# Patient Record
Sex: Female | Born: 1955 | Hispanic: No | Smoking: Former smoker
Health system: Southern US, Community
[De-identification: ages and names within clinical notes are randomized; demographics above are authoritative.]

## PROBLEM LIST (undated history)

## (undated) DIAGNOSIS — R569 Unspecified convulsions: Secondary | ICD-10-CM

## (undated) DIAGNOSIS — I10 Essential (primary) hypertension: Secondary | ICD-10-CM

## (undated) DIAGNOSIS — G8929 Other chronic pain: Secondary | ICD-10-CM

## (undated) DIAGNOSIS — M549 Dorsalgia, unspecified: Secondary | ICD-10-CM

## (undated) DIAGNOSIS — E7800 Pure hypercholesterolemia, unspecified: Secondary | ICD-10-CM

## (undated) DIAGNOSIS — J449 Chronic obstructive pulmonary disease, unspecified: Secondary | ICD-10-CM

## (undated) HISTORY — PX: CHOLECYSTECTOMY: SHX55

## (undated) HISTORY — DX: Dorsalgia, unspecified: M54.9

## (undated) HISTORY — PX: ABDOMINAL HYSTERECTOMY: SHX81

---

## 2020-09-26 ENCOUNTER — Emergency Department (HOSPITAL_COMMUNITY)
Admission: EM | Admit: 2020-09-26 | Discharge: 2020-09-26 | Disposition: A | Discharge status: Home / Self Care | Payer: Medicare (Managed Care) | Attending: Emergency Medicine | Admitting: Emergency Medicine

## 2020-09-26 ENCOUNTER — Other Ambulatory Visit: Payer: Self-pay

## 2020-09-26 ENCOUNTER — Encounter (HOSPITAL_COMMUNITY): Payer: Self-pay

## 2020-09-26 DIAGNOSIS — Z87891 Personal history of nicotine dependence: Secondary | ICD-10-CM | POA: Insufficient documentation

## 2020-09-26 DIAGNOSIS — R059 Cough, unspecified: Secondary | ICD-10-CM

## 2020-09-26 DIAGNOSIS — G8929 Other chronic pain: Secondary | ICD-10-CM | POA: Diagnosis not present

## 2020-09-26 DIAGNOSIS — R202 Paresthesia of skin: Secondary | ICD-10-CM | POA: Insufficient documentation

## 2020-09-26 DIAGNOSIS — J449 Chronic obstructive pulmonary disease, unspecified: Secondary | ICD-10-CM | POA: Insufficient documentation

## 2020-09-26 DIAGNOSIS — M549 Dorsalgia, unspecified: Secondary | ICD-10-CM | POA: Diagnosis not present

## 2020-09-26 DIAGNOSIS — I10 Essential (primary) hypertension: Secondary | ICD-10-CM | POA: Insufficient documentation

## 2020-09-26 HISTORY — DX: Essential (primary) hypertension: I10

## 2020-09-26 HISTORY — DX: Unspecified convulsions: R56.9

## 2020-09-26 HISTORY — DX: Chronic obstructive pulmonary disease, unspecified: J44.9

## 2020-09-26 HISTORY — DX: Other chronic pain: G89.29

## 2020-09-26 HISTORY — DX: Pure hypercholesterolemia, unspecified: E78.00

## 2020-09-26 MED ORDER — ALBUTEROL SULFATE HFA 108 (90 BASE) MCG/ACT IN AERS
2.0000 | INHALATION_SPRAY | RESPIRATORY_TRACT | Status: DC | PRN
  Administered 2020-09-26: 2 via RESPIRATORY_TRACT
  Filled 2020-09-26: qty 6.7

## 2020-09-26 MED ORDER — PREDNISONE 20 MG PO TABS: ORAL_TABLET | ORAL | 0 refills | Status: DC

## 2020-09-26 MED ORDER — PREDNISONE 20 MG PO TABS
60.0000 mg | ORAL_TABLET | Freq: Once | ORAL | Status: AC
  Administered 2020-09-26: 60 mg via ORAL
  Filled 2020-09-26: qty 3

## 2020-09-26 NOTE — ED Provider Notes (Signed)
[name]-EMERGENCY DEPT Provider Note   CSN: [id number] Arrival date & time: 09/26/20  1328     History Chief Complaint  Patient presents with  . Foot Pain    Angela Giles is a 65 y.o. female.  Patient with history of COPD, chronic back pain with no previous surgery history, hypertension, high cholesterol, partial seizures --presents to the emergency department today for evaluation of paresthesias in her left lower extremity.  Patient states that the symptoms started yesterday.  She initially had a pins-and-needles, numb sensation in her foot and this has spread towards the ankle and up towards the knee.  She typically walks with a walker due to her chronic back pain for which she has taken Percocet.  Patient denies other signs of stroke including: facial droop, slurred speech, aphasia, imbalance/trouble walking.  No history of abnormal heartbeats or anticoagulation.  No fevers, upper extremity symptoms including weakness, numbness or tingling.         Past Medical History:  Diagnosis Date  . Chronic back pain   . COPD (chronic obstructive pulmonary disease) (HCC)   . High cholesterol   . Hypertension   . Seizures (HCC)     There are no problems to display for this patient.   Past Surgical History:  Procedure Laterality Date  . ABDOMINAL HYSTERECTOMY    . CHOLECYSTECTOMY       OB History   No obstetric history on file.     Family History  Problem Relation Age of Onset  . Dementia Mother     Social History   Tobacco Use  . Smoking status: Former Games developer  . Smokeless tobacco: Never Used  Vaping Use  . Vaping Use: Never used  Substance Use Topics  . Alcohol use: Never  . Drug use: Never    Home Medications Prior to Admission medications   Not on File    Allergies    Beef-derived products, Fish allergy, and Milk-related compounds  Review of Systems   Review of Systems  Constitutional: Negative for fever and unexpected  weight change.  Eyes: Negative for visual disturbance.  Respiratory: Positive for cough.   Gastrointestinal: Negative for constipation.       Negative for fecal incontinence.   Genitourinary: Negative for dysuria, flank pain, hematuria and pelvic pain.       Negative for urinary incontinence or retention.  Musculoskeletal: Positive for back pain (chronic). Negative for gait problem.  Neurological: Positive for numbness (paresthesia). Negative for dizziness, seizures, facial asymmetry, speech difficulty, weakness, light-headedness and headaches.       Denies saddle paresthesias.    Physical Exam Updated Vital Signs BP (!) 193/83   Pulse 90   Temp 98.4 F (36.9 C) (Oral)   Resp 18   Ht 5' (1.524 m)   Wt 73.5 kg   SpO2 99%   BMI 31.64 kg/m   Physical Exam Vitals and nursing note reviewed.  Constitutional:      General: She is not in acute distress.    Appearance: She is well-developed.  HENT:     Head: Normocephalic and atraumatic.     Right Ear: Tympanic membrane, ear canal and external ear normal.     Left Ear: Tympanic membrane, ear canal and external ear normal.     Nose: Nose normal.     Mouth/Throat:     Pharynx: Uvula midline.  Eyes:     General: Lids are normal.     Extraocular Movements:  Right eye: No nystagmus.     Left eye: No nystagmus.     Conjunctiva/sclera: Conjunctivae normal.     Pupils: Pupils are equal, round, and reactive to light.  Neck:     Vascular: No carotid bruit.     Comments: No carotid bruit heard.  Cardiovascular:     Rate and Rhythm: Normal rate and regular rhythm.     Heart sounds: No murmur heard.     Comments: Rhythm is regular.  Pulmonary:     Effort: Pulmonary effort is normal. No respiratory distress.     Breath sounds: Normal breath sounds. No wheezing, rhonchi or rales.     Comments: Pt coughs after I have her hold her breath Abdominal:     Palpations: Abdomen is soft.     Tenderness: There is no abdominal tenderness.  There is no guarding or rebound.  Musculoskeletal:     Cervical back: Normal range of motion and neck supple. No tenderness or bony tenderness.     Thoracic back: No tenderness.     Lumbar back: Tenderness (bilateral ) present. No bony tenderness.     Right lower leg: No edema.     Left lower leg: No edema.     Comments: I do not discern any edema in the bilateral lower extremities and they appear symmetric.   Skin:    General: Skin is warm and dry.     Findings: No rash.  Neurological:     General: No focal deficit present.     Mental Status: She is alert and oriented to person, place, and time. Mental status is at baseline.     GCS: GCS eye subscore is 4. GCS verbal subscore is 5. GCS motor subscore is 6.     Cranial Nerves: No cranial nerve deficit.     Sensory: Sensory deficit present.     Motor: No weakness.     Coordination: Coordination normal.     Gait: Gait normal.     Comments: Sensation is present in L lower extremity -- however pt reports decreased sharp sensation in L foot (entire), L ankle, L lower leg.   Psychiatric:        Mood and Affect: Mood normal.     ED Results / Procedures / Treatments   Labs (all labs ordered are listed, but only abnormal results are displayed) Labs Reviewed - No data to display  EKG None  Radiology No results found.  Procedures Procedures   Medications Ordered in ED Medications  albuterol (VENTOLIN HFA) 108 (90 Base) MCG/ACT inhaler 2 puff (has no administration in time range)  predniSONE (DELTASONE) tablet 60 mg (has no administration in time range)    ED Course  I have reviewed the triage vital signs and the nursing notes.  Pertinent labs & imaging results that were available during my care of the patient were reviewed by me and considered in my medical decision making (see chart for details).  Patient seen and examined. Pt discussed with Dr. Lynelle Doctor. No compelling indication for acute stroke today however wanted another  opinion. Dr. Lynelle Doctor agrees this is most likely peripheral in nature, possibly related to chronic back problems.   Vital signs reviewed and are as follows: BP (!) 193/83   Pulse 90   Temp 98.4 F (36.9 C) (Oral)   Resp 18   Ht 5' (1.524 m)   Wt 73.5 kg   SpO2 99%   BMI 31.64 kg/m   Plan: tapered course steroids, she  is also requesting albuterol inhaler for cough.   Patient counseled to return if they have weakness in their arms or legs, slurred speech, trouble walking or talking, confusion, trouble with their balance, or if they have any other concerns. Patient verbalizes understanding and agrees with plan.      MDM Rules/Calculators/A&P                          Pt with left lower extremity paresthesias without full numbness.  No compelling symptoms for acute stroke, but does have risk factors.  Patient has chronic back problems and symptoms are likely radicular in nature.  Patient also with cough and history of COPD.  Treatment as above.  Patient discussed with and seen by attending physician.   Final Clinical Impression(s) / ED Diagnoses Final diagnoses:  Paresthesia of left lower extremity  Cough    Rx / DC Orders ED Discharge Orders         Ordered    predniSONE (DELTASONE) 20 MG tablet        09/26/20 1459           Renne Crigler, PA-C 09/26/20 1511    Linwood Dibbles, MD 09/27/20 [phone]

## 2020-09-26 NOTE — ED Triage Notes (Signed)
Patient c/o left foot tingling and numbness to the left foot. Patient states that it started yesterday.

## 2020-09-26 NOTE — Discharge Instructions (Signed)
Please read and follow all provided instructions.  Your diagnoses today include:  1. Paresthesia of left lower extremity   2. Cough     Tests performed today include:  Vital signs. See below for your results today.   Medications prescribed:   Prednisone - steroid medicine   It is best to take this medication in the morning to prevent sleeping problems. If you are diabetic, monitor your blood sugar closely and stop taking Prednisone if blood sugar is over 300. Take with food to prevent stomach upset.    Albuterol inhaler - medication that opens up your airway  Use inhaler as follows: 1-2 puffs with spacer every 4 hours as needed for wheezing, cough, or shortness of breath.   Take any prescribed medications only as directed.  Home care instructions:  Follow any educational materials contained in this packet.  BE VERY CAREFUL not to take multiple medicines containing Tylenol (also called acetaminophen). Doing so can lead to an overdose which can damage your liver and cause liver failure and possibly death.   Follow-up instructions: Please follow-up with your primary care provider in the next 7 days for further evaluation of your symptoms.   Return instructions:   Please return to the Emergency Department if you experience worsening symptoms.   Please return if you have any other emergent concerns.  Additional Information:  Your vital signs today were: BP (!) 193/83   Pulse 90   Temp 98.4 F (36.9 C) (Oral)   Resp 18   Ht 5' (1.524 m)   Wt 73.5 kg   SpO2 99%   BMI 31.64 kg/m  If your blood pressure (BP) was elevated above 135/85 this visit, please have this repeated by your doctor within one month. --------------

## 2021-03-14 ENCOUNTER — Other Ambulatory Visit: Payer: Self-pay

## 2021-03-14 ENCOUNTER — Emergency Department (HOSPITAL_COMMUNITY)
Admission: EM | Admit: 2021-03-14 | Discharge: 2021-03-14 | Disposition: A | Discharge status: Home / Self Care | Payer: Medicare (Managed Care) | Attending: Emergency Medicine | Admitting: Emergency Medicine

## 2021-03-14 ENCOUNTER — Encounter (HOSPITAL_COMMUNITY): Payer: Self-pay

## 2021-03-14 ENCOUNTER — Emergency Department (HOSPITAL_COMMUNITY): Payer: Medicare (Managed Care)

## 2021-03-14 DIAGNOSIS — G589 Mononeuropathy, unspecified: Secondary | ICD-10-CM | POA: Diagnosis not present

## 2021-03-14 DIAGNOSIS — M25572 Pain in left ankle and joints of left foot: Secondary | ICD-10-CM | POA: Insufficient documentation

## 2021-03-14 DIAGNOSIS — Z79899 Other long term (current) drug therapy: Secondary | ICD-10-CM | POA: Insufficient documentation

## 2021-03-14 DIAGNOSIS — Z87891 Personal history of nicotine dependence: Secondary | ICD-10-CM | POA: Diagnosis not present

## 2021-03-14 DIAGNOSIS — J449 Chronic obstructive pulmonary disease, unspecified: Secondary | ICD-10-CM | POA: Diagnosis not present

## 2021-03-14 DIAGNOSIS — G8929 Other chronic pain: Secondary | ICD-10-CM | POA: Diagnosis not present

## 2021-03-14 DIAGNOSIS — I10 Essential (primary) hypertension: Secondary | ICD-10-CM | POA: Diagnosis not present

## 2021-03-14 DIAGNOSIS — G588 Other specified mononeuropathies: Secondary | ICD-10-CM

## 2021-03-14 LAB — CBC
HCT: 37.4 % (ref 36.0–46.0)
Hemoglobin: 12.9 g/dL (ref 12.0–15.0)
MCH: 28.8 pg (ref 26.0–34.0)
MCHC: 34.5 g/dL (ref 30.0–36.0)
MCV: 83.5 fL (ref 80.0–100.0)
Platelets: 334 10*3/uL (ref 150–400)
RBC: 4.48 MIL/uL (ref 3.87–5.11)
RDW: 12.8 % (ref 11.5–15.5)
WBC: 8.3 10*3/uL (ref 4.0–10.5)
nRBC: 0 % (ref 0.0–0.2)

## 2021-03-14 LAB — BASIC METABOLIC PANEL
Anion gap: 12 (ref 5–15)
BUN: 15 mg/dL (ref 8–23)
CO2: 26 mmol/L (ref 22–32)
Calcium: 9.2 mg/dL (ref 8.9–10.3)
Chloride: 92 mmol/L — ABNORMAL LOW (ref 98–111)
Creatinine, Ser: 0.87 mg/dL (ref 0.44–1.00)
GFR, Estimated: >60 mL/min (ref >60)
Glucose, Bld: 111 mg/dL — ABNORMAL HIGH (ref 70–99)
Potassium: 4 mmol/L (ref 3.5–5.1)
Sodium: 130 mmol/L — ABNORMAL LOW (ref 135–145)

## 2021-03-14 LAB — MAGNESIUM: Magnesium: 2 mg/dL (ref 1.7–2.4)

## 2021-03-14 MED ORDER — ATROVENT HFA 17 MCG/ACT IN AERS: 2.0000 | INHALATION_SPRAY | Freq: Four times a day (QID) | RESPIRATORY_TRACT | 0 refills | Status: DC | PRN

## 2021-03-14 MED ORDER — HYDROCHLOROTHIAZIDE 25 MG PO TABS: 25.0000 mg | ORAL_TABLET | Freq: Every day | ORAL | 0 refills | Status: DC

## 2021-03-14 MED ORDER — IBUPROFEN 600 MG PO TABS: 600.0000 mg | ORAL_TABLET | Freq: Four times a day (QID) | ORAL | 0 refills | Status: DC | PRN

## 2021-03-14 MED ORDER — SIMVASTATIN 20 MG PO TABS: 20.0000 mg | ORAL_TABLET | Freq: Every day | ORAL | 0 refills | Status: DC

## 2021-03-14 MED ORDER — HYDROCODONE-ACETAMINOPHEN 5-325 MG PO TABS
1.0000 | ORAL_TABLET | Freq: Once | ORAL | Status: AC
Start: 2021-03-14 — End: 2021-03-14
  Administered 2021-03-14: 1 via ORAL
  Filled 2021-03-14: qty 1

## 2021-03-14 MED ORDER — METHOCARBAMOL 500 MG PO TABS: 500.0000 mg | ORAL_TABLET | Freq: Two times a day (BID) | ORAL | 0 refills | Status: DC | PRN

## 2021-03-14 MED ORDER — KETOROLAC TROMETHAMINE 15 MG/ML IJ SOLN
15.0000 mg | Freq: Once | INTRAMUSCULAR | Status: AC
  Administered 2021-03-14: 15 mg via INTRAVENOUS
  Filled 2021-03-14: qty 1

## 2021-03-14 NOTE — Care Management (Signed)
Unable to access match system at this time,

## 2021-03-14 NOTE — ED Triage Notes (Signed)
Pt reports bilateral leg numbness for a few days. Pt reports numbness and tingling is worse in the left leg. Pt denies any other numbness and weakness.

## 2021-03-14 NOTE — ED Provider Notes (Signed)
[facility]-EMERGENCY DEPT Provider Note   CSN: [id number] Arrival date & time: 03/14/21  1409     History Chief Complaint  Patient presents with   Numbness    Angela Giles is a 65 y.o. female.  65 year old female with history as below presents ER secondary to left ankle pain.  Initial onset approximately a year ago.  She was evaluated by provider approximately 5 months ago with similar complaint.  Patient reports discomfort localized to her left ankle and intermittently she appreciated to her anterior lower extremity.  Described as a "pins-and-needles" sensation.  She does typically ambulate with a walker secondary to chronic back pain.  No recent falls or new trauma.  Back pain described as her baseline.  No urinary overflow or bowel incontinence.  No IV drug use, no spinal injection history in the last 6 months.  No recent falls.  No headaches, no numbness, no difficulty speaking or swallowing.  No chest pain or dyspnea.  No abdominal pain.  Patient reports that since she moved from [place] able to obtain her home medications, including her Percocet which she typically would take for her back pain  The history is provided by the patient. No language interpreter was used.      Past Medical History:  Diagnosis Date   Chronic back pain    COPD (chronic obstructive pulmonary disease) (HCC)    High cholesterol    Hypertension    Seizures (HCC)     There are no problems to display for this patient.   Past Surgical History:  Procedure Laterality Date   ABDOMINAL HYSTERECTOMY     CHOLECYSTECTOMY       OB History   No obstetric history on file.     Family History  Problem Relation Age of Onset   Dementia Mother     Social History   Tobacco Use   Smoking status: Former   Smokeless tobacco: Never  Building services engineer Use: Never used  Substance Use Topics   Alcohol use: Never   Drug use: Never    Home Medications Prior to Admission  medications   Medication Sig Start Date End Date Taking? Authorizing Provider  methocarbamol (ROBAXIN) 500 MG tablet Take 1 tablet (500 mg total) by mouth 2 (two) times daily as needed for muscle spasms. 03/14/21  Yes Tanda Rockers A, DO  hydrochlorothiazide (HYDRODIURIL) 25 MG tablet Take 1 tablet (25 mg total) by mouth daily. 03/14/21 04/13/21 Yes Tanda Rockers A, DO  ibuprofen (ADVIL) 600 MG tablet Take 1 tablet (600 mg total) by mouth every 6 (six) hours as needed. 03/14/21  Yes Tanda Rockers A, DO  ipratropium (ATROVENT HFA) 17 MCG/ACT inhaler Inhale 2 puffs into the lungs every 6 (six) hours as needed for wheezing. 03/14/21  Yes Tanda Rockers A, DO  predniSONE (DELTASONE) 20 MG tablet 3 Tabs PO Days 1-3, then 2 tabs PO Days 4-6, then 1 tab PO Day 7-9, then Half Tab PO Day 10-12 09/26/20   Renne Crigler, PA-C  simvastatin (ZOCOR) 20 MG tablet Take 1 tablet (20 mg total) by mouth daily. 03/14/21  Yes Tanda Rockers A, DO    Allergies    Beef-derived products, Fish allergy, and Milk-related compounds  Review of Systems   Review of Systems  Constitutional:  Negative for chills and fever.  HENT:  Negative for facial swelling and trouble swallowing.   Eyes:  Negative for photophobia and visual disturbance.  Respiratory:  Negative for cough  and shortness of breath.   Cardiovascular:  Negative for chest pain and palpitations.  Gastrointestinal:  Negative for abdominal pain, nausea and vomiting.  Endocrine: Negative for polydipsia and polyuria.  Genitourinary:  Negative for difficulty urinating and hematuria.  Musculoskeletal:  Positive for arthralgias. Negative for gait problem and joint swelling.  Skin:  Negative for pallor and rash.  Neurological:  Negative for syncope and headaches.  Psychiatric/Behavioral:  Negative for agitation and confusion.    Physical Exam Updated Vital Signs BP 140/67   Pulse 60   Temp 99 F (37.2 C) (Oral)   Resp 17   SpO2 100%   Physical Exam Vitals and nursing  note reviewed.  Constitutional:      General: She is not in acute distress.    Appearance: Normal appearance.  HENT:     Head: Normocephalic and atraumatic.     Right Ear: External ear normal.     Left Ear: External ear normal.     Nose: Nose normal.     Mouth/Throat:     Mouth: Mucous membranes are moist.  Eyes:     General: No scleral icterus.       Right eye: No discharge.        Left eye: No discharge.     Extraocular Movements: Extraocular movements intact.     Pupils: Pupils are equal, round, and reactive to light.  Cardiovascular:     Rate and Rhythm: Normal rate and regular rhythm.     Pulses: Normal pulses.     Heart sounds: Normal heart sounds.  Pulmonary:     Effort: Pulmonary effort is normal. No respiratory distress.     Breath sounds: Normal breath sounds.  Abdominal:     General: Abdomen is flat.     Tenderness: There is no abdominal tenderness.  Musculoskeletal:        General: Normal range of motion.     Cervical back: Normal range of motion.     Right lower leg: No deformity. No edema.     Left lower leg: No deformity. No edema.     Comments: No lower extremity swelling.  Extremities neurovascularly intact equal bilaterally, 2+ DP and PT pulses symmetric and equal b/l.  Tenderness palpation to left ankle anteriorly. No knee abnormality to either knee. No pain with provocative testing of knee.   No midline spinous process tenderness to palpation or percussion, no crepitus or stepoff.   Skin:    General: Skin is warm and dry.     Capillary Refill: Capillary refill takes less than 2 seconds.  Neurological:     Mental Status: She is alert and oriented to person, place, and time.     GCS: GCS eye subscore is 4. GCS verbal subscore is 5. GCS motor subscore is 6.     Cranial Nerves: Cranial nerves are intact.     Sensory: Sensation is intact.     Motor: Motor function is intact.     Coordination: Coordination is intact.  Psychiatric:        Mood and Affect:  Mood normal.        Behavior: Behavior normal.    ED Results / Procedures / Treatments   Labs (all labs ordered are listed, but only abnormal results are displayed) Labs Reviewed  BASIC METABOLIC PANEL - Abnormal; Notable for the following components:      Result Value   Sodium 130 (*)    Chloride 92 (*)    Glucose, Bld 111 (*)  All other components within normal limits  CBC  MAGNESIUM    EKG None  Radiology DG Knee 2 Views Left  Result Date: 03/14/2021 CLINICAL DATA:  Acute left knee pain without known injury. EXAM: LEFT KNEE - 1-2 VIEW COMPARISON:  None. FINDINGS: No evidence of fracture, dislocation, or joint effusion. No evidence of arthropathy or other focal bone abnormality. Soft tissues are unremarkable. IMPRESSION: Negative. Electronically Signed   By: Lupita Raider M.D.   On: 03/14/2021 15:42   DG Ankle Complete Left  Result Date: 03/14/2021 CLINICAL DATA:  Acute left ankle pain without known injury. EXAM: LEFT ANKLE COMPLETE - 3+ VIEW COMPARISON:  None. FINDINGS: There is no evidence of fracture, dislocation, or joint effusion. There is no evidence of arthropathy or other focal bone abnormality. Soft tissues are unremarkable. IMPRESSION: Negative. Electronically Signed   By: Lupita Raider M.D.   On: 03/14/2021 15:42    Procedures Procedures   Medications Ordered in ED Medications  HYDROcodone-acetaminophen (NORCO/VICODIN) 5-325 MG per tablet 1 tablet (1 tablet Oral Given 03/14/21 1552)  ketorolac (TORADOL) 15 MG/ML injection 15 mg (15 mg Intravenous Given 03/14/21 1611)    ED Course  I have reviewed the triage vital signs and the nursing notes.  Pertinent labs & imaging results that were available during my care of the patient were reviewed by me and considered in my medical decision making (see chart for details).    MDM Rules/Calculators/A&P                           This patient complains of ankle pain; this involves an extensive number of  treatment Options and is a complaint that carries with it a high risk of complications and Morbidity.  Neurologic exam is nonfocal.  Nontoxic appearing female, vital signs reviewed and are stable. Serious etiologies considered.    No saddle paresthesias.  No acute low back pain. Low suspicion for spinal cord impingement, acute demyelinating syndrome, cauda equina, infection or other serious etiology. Pain is paraspinal, it is her typical chronic pain. No cellulitis.    Pt is ambulatory at her typical level. Labs reviewed and are stable. Patient reports her pain has improved following intervention. Neurological exam is non-focal.   No evidence of joint effusion or septic joint. Chronic pain to left ankle.   XR reviewed and is non-acute  Patient is from out of state and has not established with PCP in the area since move. Given refills on her Zocor, HCTZ, and atrovent after revieweing last PCP note from [facility]. Also give robaxin/motrin for complaints today.    Advised her to f/u with neurology and new PCP (given contact info regarding this)  Favor peripheral neuropathy as etiology of presentation today. Would likely benefit from further neurological eval, possible EMG.  The patient improved significantly and was discharged in stable condition. Detailed discussions were had with the patient regarding current findings, and need for close f/u with PCP or on call doctor. The patient has been instructed to return immediately if the symptoms worsen in any way for re-evaluation. Patient verbalized understanding and is in agreement with current care plan. All questions answered prior to discharge.   Final Clinical Impression(s) / ED Diagnoses Final diagnoses:  Other mononeuropathy  Chronic pain of left ankle    Rx / DC Orders ED Discharge Orders          Ordered    simvastatin (ZOCOR) 20 MG tablet  Daily        03/14/21 1806    hydrochlorothiazide (HYDRODIURIL) 25 MG tablet  Daily         03/14/21 1806    ipratropium (ATROVENT HFA) 17 MCG/ACT inhaler  Every 6 hours PRN        03/14/21 1806    ibuprofen (ADVIL) 600 MG tablet  Every 6 hours PRN        03/14/21 1806    methocarbamol (ROBAXIN) 500 MG tablet  2 times daily PRN        03/14/21 1807             Sloan Leiter, DO 03/14/21 1814

## 2021-03-14 NOTE — Progress Notes (Signed)
CSW notified case management of medication needs.

## 2021-03-15 ENCOUNTER — Telehealth: Payer: Self-pay

## 2021-03-15 NOTE — Telephone Encounter (Signed)
Spoke with patient, cannot do MATCH program as she has medicaid elsewhere. Will consult to Finacial councelling to call patient to assist in transferring medicaid over. To [place]. Discussed using good rx card. Patient states [facility] [address], as she cannot get them at [facility], she does not drive and cannot work. Reiterated to call on Monday to [facility] to get an appointment and speka to [facility] there as well.

## 2021-03-19 ENCOUNTER — Encounter: Payer: Self-pay | Admitting: Neurology

## 2021-05-25 ENCOUNTER — Ambulatory Visit (INDEPENDENT_AMBULATORY_CARE_PROVIDER_SITE_OTHER): Payer: Self-pay | Admitting: Neurology

## 2021-05-25 ENCOUNTER — Encounter: Payer: Self-pay | Admitting: Neurology

## 2021-05-25 ENCOUNTER — Other Ambulatory Visit: Payer: Self-pay

## 2021-05-25 VITALS — BP 144/77 | HR 79 | Ht 60.0 in | Wt 147.0 lb

## 2021-05-25 DIAGNOSIS — R202 Paresthesia of skin: Secondary | ICD-10-CM

## 2021-05-25 DIAGNOSIS — G40109 Localization-related (focal) (partial) symptomatic epilepsy and epileptic syndromes with simple partial seizures, not intractable, without status epilepticus: Secondary | ICD-10-CM | POA: Diagnosis not present

## 2021-05-25 MED ORDER — LACOSAMIDE 200 MG PO TABS: 200.0000 mg | ORAL_TABLET | Freq: Two times a day (BID) | ORAL | 2 refills | Status: DC

## 2021-05-25 MED ORDER — APTIOM 400 MG PO TABS: 1.0000 | ORAL_TABLET | Freq: Every day | ORAL | 2 refills | Status: DC

## 2021-05-25 NOTE — Progress Notes (Signed)
[facility] Clinic Note - Initial Visit   Date: 05/25/21  Angela Giles MRN: [id number] DOB: 1955/12/05   Dear Dr.Gray:  Thank you for your kind referral of Angela Giles for consultation of left foot numbness. Although her history is well known to you, please allow [place] to reiterate it for the purpose of our medical record. The patient was accompanied to the clinic by self.  History of Present Illness: Angela Giles is a 65 y.o. female with hypertension, hyperlipidemia, COPD, epilepsy, and chronic back pain presenting for evaluation of left foot numbness.  She moved from [place] in December and lives with her niece. She was seen in the ER on 03/14/2021 for left ankle pain and numbness. For the past few months, she has numbness over the medial foot. Symptoms are intermittent throughout the week, about 2-3 times.  No specific triggers, it can occur at rest or with activity. She had chronic low back pain and uses a walker.   She also has history of temporal lobe epilepsy, previously followed by Roselind Rily, MD at [facility]. Last office note from 12/11/2019 was reviewed and indicates that seizures are well-controlled on Vimpat 200mg  BID and Aptiom 400mg  qhs. She is requesting refills. As per their last office note: The patient has a history of multiple suspicious spells, the first of which occurred in 1977. As per the patient, she was walking in [place] at the time when she suddenly felt very hot, became very pale, her ears began to ring, her vision became blurred, and she "passed out" for several minutes. She was taken by her mother to the doctor and was told that she had photosensitivity and no further testing was done. There was no tongue biting, urinary/bowel incontinence, "shaking", or eye rolling. Since that time she has had additional episodes, all associated with "very hot feelings" which could be aborted by splashing cold water on her body. One such  event occurred when she was lying in bed at approximately 2 AM she got up to vomit and use the bathroom, she again felt hot, her ears began to ring, and she woke up on the floor. She was told by her boyfriend that she was altered for "a couple of minutes", but she was able to speak to him coherently, knew her name, and knew where she was. She did, however, feel that her head was "heavy" and "not right" for several days afterwards. She was sent to see a neurologist (Dr. ) who felt that she may have seizures and referred her to [facility]. She was also sent to a cardiologist and had a normal EKG and carotid ultrasound, reportedly. She has no history of perinatal injury, febrile seizure, significant traumatic brain injury, stroke, brain hemorrhages, brain tumors, meningitis, encephalitis, alcohol/ drug abuse, or other epileptic predispositions.  She has not establsihed care with primary care provider  She moved from [place] in December and she lives with her niece.  She does not work.   Out-side paper records, electronic medical record, and images have been reviewed where available and summarized as:  07/2012 EEG - Unremarkable 08/2012 vEEG (5 days) - Frequent rhythmic delta activity and sharp waves in the right frontal temporal area with spread to left frontal area in sleep. Rare left temporal sharp waves 08/2013 AEEG - Unremarkable 07/2014 vEEG (5 days) - Unremarkable 10/07/2015 EEG - Epileptiform activity in the bitemporal region compatible with possible seizure disorder. Intermittent bilateral frontotemporal slowing. 01/2016 AEEG (3  days) - left temporal sharp waves 08/2012 MRI brain, 3 Tesla, with and without contrast, with seizure protocol - Unremarkable  Past Medical History:  Diagnosis Date   Chronic back pain    COPD (chronic obstructive pulmonary disease) (HCC)    Dorsalgia    High cholesterol    Hypertension    Seizures (HCC)     Past Surgical History:  Procedure Laterality Date    ABDOMINAL HYSTERECTOMY     CHOLECYSTECTOMY       Medications:  Outpatient Encounter Medications as of 05/25/2021  Medication Sig   albuterol (VENTOLIN HFA) 108 (90 Base) MCG/ACT inhaler SMARTSIG:1-2 Puff(s) By Mouth Every 4 Hours PRN   amLODipine (NORVASC) 10 MG tablet Take 10 mg by mouth daily.   BAYER ASPIRIN PO Take by mouth.   cyclobenzaprine (FLEXERIL) 10 MG tablet Take by mouth.   diphenhydrAMINE (BENADRYL) 25 MG tablet Take 25 mg by mouth every 6 (six) hours as needed.   hydrochlorothiazide (HYDRODIURIL) 25 MG tablet Take 1 tablet (25 mg total) by mouth daily.   ibuprofen (ADVIL) 600 MG tablet Take 1 tablet (600 mg total) by mouth every 6 (six) hours as needed.   ipratropium (ATROVENT HFA) 17 MCG/ACT inhaler Inhale 2 puffs into the lungs every 6 (six) hours as needed for wheezing.   ipratropium (ATROVENT) 0.06 % nasal spray Place 1 spray into both nostrils daily.   loratadine (CLARITIN) 10 MG tablet Take 10 mg by mouth daily.   Melatonin 5 MG CAPS Take by mouth.   methocarbamol (ROBAXIN) 500 MG tablet Take 1 tablet (500 mg total) by mouth 2 (two) times daily as needed for muscle spasms.   montelukast (SINGULAIR) 5 MG chewable tablet Chew 10 mg by mouth at bedtime.   naproxen (NAPROSYN) 500 MG tablet Take 500 mg by mouth 2 (two) times daily.   omeprazole (PRILOSEC) 20 MG capsule Take 20 mg by mouth daily.   simvastatin (ZOCOR) 20 MG tablet Take 1 tablet (20 mg total) by mouth daily.   SYMBICORT 160-4.5 MCG/ACT inhaler SMARTSIG:2 Puff(s) By Mouth Twice Daily   [DISCONTINUED] APTIOM 400 MG TABS Take 1 tablet by mouth daily.   [DISCONTINUED] lacosamide (VIMPAT) 200 MG TABS tablet Take 200 mg by mouth 2 (two) times daily.   APTIOM 400 MG TABS Take 1 tablet (400 mg total) by mouth daily.   lacosamide (VIMPAT) 200 MG TABS tablet Take 1 tablet (200 mg total) by mouth 2 (two) times daily.   predniSONE (DELTASONE) 20 MG tablet 3 Tabs PO Days 1-3, then 2 tabs PO Days 4-6, then 1 tab PO Day  7-9, then Half Tab PO Day 10-12 (Patient not taking: Reported on 05/25/2021)   simvastatin (ZOCOR) 20 MG tablet Take by mouth. (Patient not taking: Reported on 05/25/2021)   No facility-administered encounter medications on file as of 05/25/2021.    Allergies:  Allergies  Allergen Reactions   Beef-Derived Products    Fish Allergy    Milk-Related Compounds     Family History: Family History  Problem Relation Age of Onset   Dementia Mother     Social History: Social History   Tobacco Use   Smoking status: Former   Smokeless tobacco: Never  Building services engineer Use: Never used  Substance Use Topics   Alcohol use: Never   Drug use: Never   Social History   Social History Narrative   Lives in a one story home with a basement     Vital Signs:  BP (!)  144/77   Pulse 79   Ht 5' (1.524 m)   Wt 147 lb (66.7 kg)   SpO2 98%   BMI 28.71 kg/m   Neurological Exam: MENTAL STATUS including orientation to time, place, person, recent and remote memory, attention span and concentration, language, and fund of knowledge is normal.  Speech is not dysarthric.  CRANIAL NERVES: II:  No visual field defects.    III-IV-VI: Pupils equal round and reactive to light.  Normal conjugate, extra-ocular eye movements in all directions of gaze.  No nystagmus.  No ptosis.   V:  Normal facial sensation.    VII:  Normal facial symmetry and movements.   VIII:  Normal hearing and vestibular function.   IX-X:  Normal palatal movement.   XI:  Normal shoulder shrug and head rotation.   XII:  Normal tongue strength and range of motion, no deviation or fasciculation.  MOTOR:  No atrophy, fasciculations or abnormal movements.  No pronator drift.   Upper Extremity:  Right  Left  Deltoid  5/5   5/5   Biceps  5/5   5/5   Triceps  5/5   5/5   Infraspinatus 5/5  5/5  Medial pectoralis 5/5  5/5  Wrist extensors  5/5   5/5   Wrist flexors  5/5   5/5   Finger extensors  5/5   5/5   Finger flexors  5/5    5/5   Dorsal interossei  5/5   5/5   Abductor pollicis  5/5   5/5   Tone (Ashworth scale)  0  0   Lower Extremity:  Right  Left  Hip flexors  5/5   5/5   Hip extensors  5/5   5/5   Adductor 5/5  5/5  Abductor 5/5  5/5  Knee flexors  5/5   5/5   Knee extensors  5/5   5/5   Dorsiflexors  5/5   5/5   Plantarflexors  5/5   5/5   Toe extensors  5/5   5/5   Toe flexors  5/5   5/5   Tone (Ashworth scale)  0  0   MSRs:  Right        Left                  brachioradialis 2+  2+  biceps 2+  2+  triceps 2+  2+  patellar 2+  2+  ankle jerk 2+  2+  Hoffman no  no  plantar response down  down   SENSORY:  Temperature mildly reduced over the dorsum of the left foot, otherwise sensation to pin prick and vibration is normal. Romberg's sign absent.   COORDINATION/GAIT: Normal finger-to- nose-finger.  Intact rapid alternating movements bilaterally.  Gait is assisted with walker, stable.   IMPRESSION: Left >right foot paresthesias - ?L5 radiculopathy vs neuropathy  - NCS/EMG of the legs to characterize the nature of her symptoms  2.   Temporal lobe epilepsy, seizure-free >2 years, stable.  - Refills provided for Vimpat 200mg  BID and Aptiom 400mg  at bedtime  - Refer to Dr.Karen , epileptologist, for ongoing management  Further recommendations pending results.    Thank you for allowing me to participate in patient's care.  If I can answer any additional questions, I would be pleased to do so.    Sincerely,    Rylin Saez K. , DO

## 2021-05-25 NOTE — Patient Instructions (Signed)

## 2021-05-28 ENCOUNTER — Telehealth: Payer: Self-pay | Admitting: Neurology

## 2021-05-28 NOTE — Telephone Encounter (Signed)
Called patient to get more information about her fall. Patient stated yesterday afternoon (05/27/2021) she slipped on her stairs and fell. Patient states she hit her toes, hurt above her ankle, and hurt under her hip. Patient states she can walk, but her toes hurt. Patient wanted to ask Dr. Allena Katz if she should reschedule her EMG that is on 06/02/2021 to a later date? She does not want her fall to interfere with her test and wanted to see what Dr. Allena Katz recommended.

## 2021-05-28 NOTE — Telephone Encounter (Signed)
Called patient and informed her that per Dr. Allena Katz it would be best to go ahead and rescheduled her EMG to a later date so that she can heal from her fall. Patient agreed and was transferred up front to reschedule. Patient had no further questions or concerns.

## 2021-05-28 NOTE — Telephone Encounter (Signed)
Best to let her heal from her fall and reschedule EMG to a later date.

## 2021-05-28 NOTE — Telephone Encounter (Signed)
Pt called in stating she is supposed to have an EMG on 06/02/21, but she fell and hurt her leg that will be tested. She is worried it may affect the test.

## 2021-06-02 ENCOUNTER — Encounter: Payer: Medicaid - Out of State | Admitting: Neurology

## 2021-06-08 ENCOUNTER — Other Ambulatory Visit: Payer: Self-pay

## 2021-06-08 ENCOUNTER — Encounter: Payer: Self-pay | Admitting: Neurology

## 2021-06-08 ENCOUNTER — Ambulatory Visit: Payer: Medicare (Managed Care) | Admitting: Neurology

## 2021-06-08 VITALS — BP 150/78 | HR 70 | Ht 60.0 in | Wt 143.4 lb

## 2021-06-08 DIAGNOSIS — G40009 Localization-related (focal) (partial) idiopathic epilepsy and epileptic syndromes with seizures of localized onset, not intractable, without status epilepticus: Secondary | ICD-10-CM | POA: Diagnosis not present
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
## 2021-06-08 NOTE — Patient Instructions (Addendum)
Good to meet you. Continue Vimpat 200mg  twice a day and Aptiom 400mg  every night. Follow-up in 6 months, call for any changes.   Seizure Precautions: 1. If medication has been prescribed for you to prevent seizures, take it exactly as directed.  Do not stop taking the medicine without talking to your doctor first, even if you have not had a seizure in a long time.   2. Avoid activities in which a seizure would cause danger to yourself or to others.  Don't operate dangerous machinery, swim alone, or climb in high or dangerous places, such as on ladders, roofs, or girders.  Do not drive unless your doctor says you may.  3. If you have any warning that you may have a seizure, lay down in a safe place where you can't hurt yourself.    4.  No driving for 6 months from last seizure, as per [facility].   Please refer to the following link on the Epile[employer] Foundation of America's website for more information: http://www.epilepsyfoundation.org/answerplace/Social/driving/drivingu.cfm   5.  Maintain good sleep hygiene. Avoid alcohol.  6.  Contact your doctor if you have any problems that may be related to the medicine you are taking.  7.  Call 911 and bring the patient back to the ED if:        A.  The seizure lasts longer than 5 minutes.       B.  The patient doesn't awaken shortly after the seizure  C.  The patient has new problems such as difficulty seeing, speaking or moving  D.  The patient was injured during the seizure  E.  The patient has a temperature over 102 F (39C)  F.  The patient vomited and now is having trouble breathing

## 2021-06-14 ENCOUNTER — Other Ambulatory Visit: Payer: Self-pay

## 2021-06-14 ENCOUNTER — Emergency Department (HOSPITAL_COMMUNITY): Payer: Medicare (Managed Care)

## 2021-06-14 ENCOUNTER — Observation Stay (HOSPITAL_COMMUNITY)
Admission: EM | Admit: 2021-06-14 | Discharge: 2021-06-15 | Disposition: A | Discharge status: Home / Self Care | Payer: Medicare (Managed Care) | Attending: Emergency Medicine | Admitting: Emergency Medicine

## 2021-06-14 ENCOUNTER — Encounter (HOSPITAL_COMMUNITY): Payer: Self-pay | Admitting: Emergency Medicine

## 2021-06-14 DIAGNOSIS — E876 Hypokalemia: Secondary | ICD-10-CM | POA: Insufficient documentation

## 2021-06-14 DIAGNOSIS — Z7982 Long term (current) use of aspirin: Secondary | ICD-10-CM | POA: Diagnosis not present

## 2021-06-14 DIAGNOSIS — Z20822 Contact with and (suspected) exposure to covid-19: Secondary | ICD-10-CM | POA: Diagnosis not present

## 2021-06-14 DIAGNOSIS — E871 Hypo-osmolality and hyponatremia: Secondary | ICD-10-CM | POA: Insufficient documentation

## 2021-06-14 DIAGNOSIS — J09X9 Influenza due to identified novel influenza A virus with other manifestations: Secondary | ICD-10-CM | POA: Insufficient documentation

## 2021-06-14 DIAGNOSIS — Z79899 Other long term (current) drug therapy: Secondary | ICD-10-CM | POA: Diagnosis not present

## 2021-06-14 DIAGNOSIS — J101 Influenza due to other identified influenza virus with other respiratory manifestations: Secondary | ICD-10-CM | POA: Diagnosis present

## 2021-06-14 DIAGNOSIS — J9620 Acute and chronic respiratory failure, unspecified whether with hypoxia or hypercapnia: Secondary | ICD-10-CM | POA: Diagnosis not present

## 2021-06-14 DIAGNOSIS — I10 Essential (primary) hypertension: Secondary | ICD-10-CM | POA: Diagnosis not present

## 2021-06-14 DIAGNOSIS — Z87891 Personal history of nicotine dependence: Secondary | ICD-10-CM | POA: Insufficient documentation

## 2021-06-14 DIAGNOSIS — J441 Chronic obstructive pulmonary disease with (acute) exacerbation: Secondary | ICD-10-CM

## 2021-06-14 DIAGNOSIS — R42 Dizziness and giddiness: Secondary | ICD-10-CM

## 2021-06-14 DIAGNOSIS — R0602 Shortness of breath: Secondary | ICD-10-CM | POA: Diagnosis present

## 2021-06-14 LAB — CBC WITH DIFFERENTIAL/PLATELET
Abs Immature Granulocytes: 0.03 10*3/uL (ref 0.00–0.07)
Basophils Absolute: 0 10*3/uL (ref 0.0–0.1)
Basophils Relative: 0 %
Eosinophils Absolute: 0 10*3/uL (ref 0.0–0.5)
Eosinophils Relative: 0 %
HCT: 40.6 % (ref 36.0–46.0)
Hemoglobin: 15.1 g/dL — ABNORMAL HIGH (ref 12.0–15.0)
Immature Granulocytes: 0 %
Lymphocytes Relative: 21 %
Lymphs Abs: 1.4 10*3/uL (ref 0.7–4.0)
MCH: 29 pg (ref 26.0–34.0)
MCHC: 37.2 g/dL — ABNORMAL HIGH (ref 30.0–36.0)
MCV: 77.9 fL — ABNORMAL LOW (ref 80.0–100.0)
Monocytes Absolute: 0.6 10*3/uL (ref 0.1–1.0)
Monocytes Relative: 9 %
Neutro Abs: 4.7 10*3/uL (ref 1.7–7.7)
Neutrophils Relative %: 70 %
Platelets: 361 10*3/uL (ref 150–400)
RBC: 5.21 MIL/uL — ABNORMAL HIGH (ref 3.87–5.11)
RDW: 12 % (ref 11.5–15.5)
WBC: 6.8 10*3/uL (ref 4.0–10.5)
nRBC: 0 % (ref 0.0–0.2)

## 2021-06-14 LAB — BASIC METABOLIC PANEL
Anion gap: 13 (ref 5–15)
Anion gap: 14 (ref 5–15)
BUN: 10 mg/dL (ref 8–23)
BUN: 9 mg/dL (ref 8–23)
CO2: 32 mmol/L (ref 22–32)
CO2: 24 mmol/L (ref 22–32)
Calcium: 9.2 mg/dL (ref 8.9–10.3)
Calcium: 9 mg/dL (ref 8.9–10.3)
Chloride: 78 mmol/L — ABNORMAL LOW (ref 98–111)
Chloride: 86 mmol/L — ABNORMAL LOW (ref 98–111)
Creatinine, Ser: 0.8 mg/dL (ref 0.44–1.00)
Creatinine, Ser: 0.97 mg/dL (ref 0.44–1.00)
GFR, Estimated: >60 mL/min (ref >60)
GFR, Estimated: >60 mL/min (ref >60)
Glucose, Bld: 143 mg/dL — ABNORMAL HIGH (ref 70–99)
Glucose, Bld: 231 mg/dL — ABNORMAL HIGH (ref 70–99)
Potassium: 2 mmol/L — CL (ref 3.5–5.1)
Potassium: 2.3 mmol/L — CL (ref 3.5–5.1)
Sodium: 123 mmol/L — ABNORMAL LOW (ref 135–145)
Sodium: 124 mmol/L — ABNORMAL LOW (ref 135–145)

## 2021-06-14 LAB — RESP PANEL BY RT-PCR (FLU A&B, COVID) ARPGX2
Influenza A by PCR: POSITIVE — AB
Influenza B by PCR: NEGATIVE
SARS Coronavirus 2 by RT PCR: NEGATIVE

## 2021-06-14 LAB — TROPONIN I (HIGH SENSITIVITY)
Troponin I (High Sensitivity): 5 ng/L (ref <18)
Troponin I (High Sensitivity): 6 ng/L (ref <18)

## 2021-06-14 LAB — D-DIMER, QUANTITATIVE: D-Dimer, Quant: 0.36 ug/mL-FEU (ref 0.00–0.50)

## 2021-06-14 LAB — CBG MONITORING, ED: Glucose-Capillary: 143 mg/dL — ABNORMAL HIGH (ref 70–99)

## 2021-06-14 LAB — TSH: TSH: 1.076 u[IU]/mL (ref 0.350–4.500)

## 2021-06-14 LAB — MAGNESIUM: Magnesium: 1.7 mg/dL (ref 1.7–2.4)

## 2021-06-14 LAB — HIV ANTIBODY (ROUTINE TESTING W REFLEX): HIV Screen 4th Generation wRfx: NONREACTIVE

## 2021-06-14 MED ORDER — ONDANSETRON HCL 4 MG PO TABS: 4.0000 mg | ORAL_TABLET | Freq: Four times a day (QID) | ORAL | Status: DC | PRN

## 2021-06-14 MED ORDER — ALBUTEROL SULFATE (2.5 MG/3ML) 0.083% IN NEBU
3.0000 mL | INHALATION_SOLUTION | RESPIRATORY_TRACT | Status: DC | PRN
  Administered 2021-06-15 (×2): 3 mL via RESPIRATORY_TRACT
  Filled 2021-06-14 (×2): qty 3

## 2021-06-14 MED ORDER — POTASSIUM CHLORIDE 10 MEQ/100ML IV SOLN
10.0000 meq | INTRAVENOUS | Status: AC
  Administered 2021-06-14 (×3): 10 meq via INTRAVENOUS
  Filled 2021-06-14 (×4): qty 100

## 2021-06-14 MED ORDER — SIMVASTATIN 20 MG PO TABS
20.0000 mg | ORAL_TABLET | Freq: Every day | ORAL | Status: DC
  Administered 2021-06-14: 20:00:00 20 mg via ORAL
  Filled 2021-06-14: qty 1

## 2021-06-14 MED ORDER — ACETAMINOPHEN 325 MG PO TABS
650.0000 mg | ORAL_TABLET | Freq: Four times a day (QID) | ORAL | Status: DC | PRN
  Administered 2021-06-14: 20:00:00 650 mg via ORAL
  Filled 2021-06-14: qty 2

## 2021-06-14 MED ORDER — GUAIFENESIN 100 MG/5ML PO LIQD
5.0000 mL | ORAL | Status: DC | PRN
  Administered 2021-06-14 – 2021-06-15 (×2): 5 mL via ORAL
  Filled 2021-06-14 (×2): qty 5

## 2021-06-14 MED ORDER — ACETAMINOPHEN 650 MG RE SUPP: 650.0000 mg | Freq: Four times a day (QID) | RECTAL | Status: DC | PRN

## 2021-06-14 MED ORDER — SODIUM CHLORIDE 0.9 % IV SOLN: INTRAVENOUS | Status: AC

## 2021-06-14 MED ORDER — ESLICARBAZEPINE ACETATE 400 MG PO TABS: 1.0000 | ORAL_TABLET | Freq: Every day | ORAL | Status: DC

## 2021-06-14 MED ORDER — POTASSIUM CHLORIDE CRYS ER 20 MEQ PO TBCR
40.0000 meq | EXTENDED_RELEASE_TABLET | Freq: Once | ORAL | Status: AC
  Administered 2021-06-14: 16:00:00 40 meq via ORAL
  Filled 2021-06-14: qty 2

## 2021-06-14 MED ORDER — ENOXAPARIN SODIUM 40 MG/0.4ML IJ SOSY
40.0000 mg | PREFILLED_SYRINGE | INTRAMUSCULAR | Status: DC
  Administered 2021-06-14: 20:00:00 40 mg via SUBCUTANEOUS
  Filled 2021-06-14: qty 0.4

## 2021-06-14 MED ORDER — OXYCODONE HCL 5 MG PO TABS
2.5000 mg | ORAL_TABLET | Freq: Four times a day (QID) | ORAL | Status: DC | PRN
  Administered 2021-06-14 – 2021-06-15 (×2): 2.5 mg via ORAL
  Filled 2021-06-14 (×3): qty 1

## 2021-06-14 MED ORDER — METHOCARBAMOL 500 MG PO TABS
500.0000 mg | ORAL_TABLET | Freq: Two times a day (BID) | ORAL | Status: DC | PRN
  Administered 2021-06-14: 20:00:00 500 mg via ORAL
  Filled 2021-06-14: qty 1

## 2021-06-14 MED ORDER — PANTOPRAZOLE SODIUM 40 MG PO TBEC
40.0000 mg | DELAYED_RELEASE_TABLET | Freq: Every day | ORAL | Status: DC
  Administered 2021-06-15: 08:00:00 40 mg via ORAL
  Filled 2021-06-14: qty 1

## 2021-06-14 MED ORDER — IPRATROPIUM-ALBUTEROL 0.5-2.5 (3) MG/3ML IN SOLN
3.0000 mL | Freq: Once | RESPIRATORY_TRACT | Status: AC
  Administered 2021-06-14: 15:00:00 3 mL via RESPIRATORY_TRACT

## 2021-06-14 MED ORDER — MAGNESIUM SULFATE 2 GM/50ML IV SOLN
2.0000 g | Freq: Once | INTRAVENOUS | Status: AC
  Administered 2021-06-14: 23:00:00 2 g via INTRAVENOUS
  Filled 2021-06-14: qty 50

## 2021-06-14 MED ORDER — METHYLPREDNISOLONE SODIUM SUCC 125 MG IJ SOLR
125.0000 mg | Freq: Once | INTRAMUSCULAR | Status: AC
  Administered 2021-06-14: 17:00:00 125 mg via INTRAVENOUS
  Filled 2021-06-14: qty 2

## 2021-06-14 MED ORDER — SENNOSIDES-DOCUSATE SODIUM 8.6-50 MG PO TABS: 1.0000 | ORAL_TABLET | Freq: Every evening | ORAL | Status: DC | PRN

## 2021-06-14 MED ORDER — POTASSIUM CHLORIDE CRYS ER 20 MEQ PO TBCR
40.0000 meq | EXTENDED_RELEASE_TABLET | ORAL | Status: AC
  Administered 2021-06-14 – 2021-06-15 (×2): 40 meq via ORAL
  Filled 2021-06-14 (×2): qty 2

## 2021-06-14 MED ORDER — MOMETASONE FURO-FORMOTEROL FUM 200-5 MCG/ACT IN AERO
2.0000 | INHALATION_SPRAY | Freq: Two times a day (BID) | RESPIRATORY_TRACT | Status: DC
  Administered 2021-06-15: 08:00:00 2 via RESPIRATORY_TRACT
  Filled 2021-06-14: qty 8.8

## 2021-06-14 MED ORDER — ONDANSETRON HCL 4 MG/2ML IJ SOLN: 4.0000 mg | Freq: Four times a day (QID) | INTRAMUSCULAR | Status: DC | PRN

## 2021-06-14 MED ORDER — MONTELUKAST SODIUM 5 MG PO CHEW
10.0000 mg | CHEWABLE_TABLET | Freq: Every day | ORAL | Status: DC
  Administered 2021-06-14: 21:00:00 10 mg via ORAL
  Filled 2021-06-14 (×2): qty 2

## 2021-06-14 MED ORDER — LACOSAMIDE 50 MG PO TABS
200.0000 mg | ORAL_TABLET | Freq: Two times a day (BID) | ORAL | Status: DC
  Administered 2021-06-14 – 2021-06-15 (×2): 200 mg via ORAL
  Filled 2021-06-14 (×2): qty 4

## 2021-06-14 MED ORDER — SODIUM CHLORIDE 0.9 % IV BOLUS
500.0000 mL | Freq: Once | INTRAVENOUS | Status: AC
  Administered 2021-06-14: 16:00:00 500 mL via INTRAVENOUS

## 2021-06-14 MED ORDER — POTASSIUM CHLORIDE 10 MEQ/100ML IV SOLN
10.0000 meq | INTRAVENOUS | Status: AC
  Administered 2021-06-14 – 2021-06-15 (×5): 10 meq via INTRAVENOUS
  Filled 2021-06-14 (×5): qty 100

## 2021-06-14 MED ORDER — MELATONIN 5 MG PO TABS
5.0000 mg | ORAL_TABLET | Freq: Every day | ORAL | Status: DC
  Administered 2021-06-14: 21:00:00 5 mg via ORAL
  Filled 2021-06-14: qty 1

## 2021-06-14 MED ORDER — PREDNISONE 20 MG PO TABS
40.0000 mg | ORAL_TABLET | Freq: Every day | ORAL | Status: DC
  Administered 2021-06-15: 08:00:00 40 mg via ORAL
  Filled 2021-06-14: qty 2

## 2021-06-14 NOTE — ED Provider Notes (Signed)
[facility] EMERGENCY DEPARTMENT Provider Note   CSN: [id number] Arrival date & time: 06/14/21  1120     History Chief Complaint  Patient presents with   Dizziness   Weakness    SYRENITY KUZNICKI is a 65 y.o. female.   Dizziness Associated symptoms: shortness of breath and weakness   Associated symptoms: no chest pain, no headaches, no palpitations and no vomiting   Weakness Associated symptoms: shortness of breath   Associated symptoms: no abdominal pain, no arthralgias, no chest pain, no cough, no dizziness, no dysuria, no fever, no headaches, no seizures and no vomiting    65 year old female presenting to the emergency department with a chief complaint of lightheadedness.  The patient states that for the last 5 days she has had flulike symptoms to include fatigue, myalgias, lightheadedness, mild shortness of breath.  She has a history of COPD and has been using home albuterol with no significant improvement.  She does report a recent sick contact.  She denies any chest pain.  She denies any focal neurologic deficits.  She does endorse feeling unsteady on her feet on attempts at standing due to lightheadedness.  No syncope.  Past Medical History:  Diagnosis Date   Chronic back pain    COPD (chronic obstructive pulmonary disease) (HCC)    Dorsalgia    High cholesterol    Hypertension    Seizures ([place])     Patient Active Problem List   Diagnosis Date Noted   Influenza due to influenza A virus 06/14/2021    Past Surgical History:  Procedure Laterality Date   ABDOMINAL HYSTERECTOMY     CHOLECYSTECTOMY       OB History   No obstetric history on file.     Family History  Problem Relation Age of Onset   Dementia Mother     Social History   Tobacco Use   Smoking status: Former   Smokeless tobacco: Never  Scientific laboratory technician Use: Never used  Substance Use Topics   Alcohol use: Never   Drug use: Never    Home Medications Prior to  Admission medications   Medication Sig Start Date End Date Taking? Authorizing Provider  albuterol (VENTOLIN HFA) 108 (90 Base) MCG/ACT inhaler 1-2 puffs every 4 (four) hours as needed for shortness of breath or wheezing. 05/08/21  Yes [provider]  amLODipine (NORVASC) 10 MG tablet Take 10 mg by mouth daily. 03/10/21  Yes [provider]  APTIOM 400 MG TABS Take 1 tablet (400 mg total) by mouth daily. 05/25/21  Yes Patel, Donika K, DO  aspirin 81 MG EC tablet Take 81 mg by mouth daily. Swallow whole.   Yes [provider]  cyclobenzaprine (FLEXERIL) 10 MG tablet Take 10 mg by mouth 3 (three) times daily as needed. 05/08/21  Yes [provider]  diphenhydrAMINE (BENADRYL) 25 MG tablet Take 25 mg by mouth every 6 (six) hours as needed.   Yes [provider]  furosemide (LASIX) 20 MG tablet Take by mouth. 05/08/21  Yes [provider]  ibuprofen (ADVIL) 600 MG tablet Take 1 tablet (600 mg total) by mouth every 6 (six) hours as needed. 03/14/21  Yes Wynona Dove A, DO  lacosamide (VIMPAT) 200 MG TABS tablet Take 1 tablet (200 mg total) by mouth 2 (two) times daily. 05/25/21  Yes Patel, Donika K, DO  loratadine (CLARITIN) 10 MG tablet Take 10 mg by mouth daily. 12/11/20  Yes [provider]  Melatonin  5 MG CAPS Take 5 mg by mouth at bedtime as needed (sleep).   Yes [provider]  montelukast (SINGULAIR) 5 MG chewable tablet Chew 10 mg by mouth at bedtime. 03/19/21  Yes [provider]  omeprazole (PRILOSEC) 20 MG capsule Take 20 mg by mouth daily.   Yes [provider]  simvastatin (ZOCOR) 20 MG tablet Take 20 mg by mouth daily at 6 PM. 05/08/21  Yes [provider]  hydrochlorothiazide (HYDRODIURIL) 25 MG tablet Take 1 tablet (25 mg total) by mouth daily. 03/14/21 06/08/21  Wynona Dove A, DO  ipratropium (ATROVENT HFA) 17 MCG/ACT inhaler Inhale 2 puffs into the lungs every 6 (six) hours as needed for  wheezing. Patient not taking: Reported on 06/08/2021 03/14/21   Jeanell Sparrow, DO  methocarbamol (ROBAXIN) 500 MG tablet Take 1 tablet (500 mg total) by mouth 2 (two) times daily as needed for muscle spasms. Patient not taking: Reported on 06/14/2021 03/14/21   Wynona Dove A, DO  oxyCODONE-acetaminophen (PERCOCET/ROXICET) 5-325 MG tablet Take by mouth every 4 (four) hours as needed for severe pain. Patient not taking: Reported on 06/14/2021    [provider]    Allergies    Beef-derived products, Fish allergy, and Milk-related compounds  Review of Systems   Review of Systems  Constitutional:  Positive for fatigue. Negative for chills and fever.  HENT:  Negative for ear pain and sore throat.   Eyes:  Negative for pain and visual disturbance.  Respiratory:  Positive for shortness of breath and wheezing. Negative for cough.   Cardiovascular:  Negative for chest pain and palpitations.  Gastrointestinal:  Negative for abdominal pain and vomiting.  Genitourinary:  Negative for dysuria and hematuria.  Musculoskeletal:  Negative for arthralgias and back pain.  Skin:  Negative for color change and rash.  Neurological:  Positive for weakness and light-headedness. Negative for dizziness, seizures, syncope, facial asymmetry, speech difficulty, numbness and headaches.  All other systems reviewed and are negative.  Physical Exam Updated Vital Signs BP (!) 145/69 (BP Location: Right Arm)   Pulse 72   Temp 97.9 F (36.6 C) (Oral)   Resp 17   SpO2 97%   Physical Exam Vitals and nursing note reviewed.  Constitutional:      General: She is not in acute distress.    Appearance: She is well-developed.  HENT:     Head: Normocephalic and atraumatic.  Eyes:     Conjunctiva/sclera: Conjunctivae normal.     Pupils: Pupils are equal, round, and reactive to light.  Cardiovascular:     Rate and Rhythm: Normal rate and regular rhythm.     Heart sounds: No murmur heard. Pulmonary:      Effort: Pulmonary effort is normal. No respiratory distress.     Breath sounds: Normal breath sounds.  Abdominal:     General: There is no distension.     Palpations: Abdomen is soft.     Tenderness: There is no abdominal tenderness. There is no guarding.  Musculoskeletal:        General: No swelling, deformity or signs of injury.     Cervical back: Neck supple.  Skin:    General: Skin is warm and dry.     Capillary Refill: Capillary refill takes less than 2 seconds.     Findings: No lesion or rash.  Neurological:     Mental Status: She is alert.     Comments: MENTAL STATUS EXAM:    Orientation: Alert and oriented to  person, place and time.  Memory: Cooperative, follows commands well.  Language: Speech is clear and language is normal.   CRANIAL NERVES:    CN 2 (Optic): Visual fields intact to confrontation.  CN 3,4,6 (EOM): Pupils equal and reactive to light. Full extraocular eye movement without nystagmus.  CN 5 (Trigeminal): Facial sensation is normal, no weakness of masticatory muscles.  CN 7 (Facial): No facial weakness or asymmetry.  CN 8 (Auditory): Auditory acuity grossly normal.  CN 9,10 (Glossophar): The uvula is midline, the palate elevates symmetrically.  CN 11 (spinal access): Normal sternocleidomastoid and trapezius strength.  CN 12 (Hypoglossal): The tongue is midline. No atrophy or fasciculations.Marland Kitchen   MOTOR:  Muscle Strength: 5/5RUE, 5/5LUE, 5/5RLE, 5/5LLE.   COORDINATION:   Intact finger-to-nose, no tremor.   SENSATION:   Intact to light touch all four extremities.  GAIT: Gait not assessed as patient became lightheaded on standing   Psychiatric:        Mood and Affect: Mood normal.    ED Results / Procedures / Treatments   Labs (all labs ordered are listed, but only abnormal results are displayed) Labs Reviewed  RESP PANEL BY RT-PCR (FLU A&B, COVID) ARPGX2 - Abnormal; Notable for the following components:      Result Value   Influenza A by PCR POSITIVE  (*)    All other components within normal limits  BASIC METABOLIC PANEL - Abnormal; Notable for the following components:   Sodium 123 (*)    Potassium 2.0 (*)    Chloride 78 (*)    Glucose, Bld 143 (*)    All other components within normal limits  CBC WITH DIFFERENTIAL/PLATELET - Abnormal; Notable for the following components:   RBC 5.21 (*)    Hemoglobin 15.1 (*)    MCV 77.9 (*)    MCHC 37.2 (*)    All other components within normal limits  CBG MONITORING, ED - Abnormal; Notable for the following components:   Glucose-Capillary 143 (*)    All other components within normal limits  TSH  D-DIMER, QUANTITATIVE  MAGNESIUM  HIV ANTIBODY (ROUTINE TESTING W REFLEX)  BASIC METABOLIC PANEL  CBC  BASIC METABOLIC PANEL  TROPONIN I (HIGH SENSITIVITY)  TROPONIN I (HIGH SENSITIVITY)    EKG EKG Interpretation  Date/Time:  Sunday June 14 2021 15:31:29 EST Ventricular Rate:  76 PR Interval:  179 QRS Duration: 90 QT Interval:  516 QTC Calculation: 581 R Axis:   85 Text Interpretation: Sinus rhythm Borderline right axis deviation Probable anteroseptal infarct, old Repol abnrm suggests ischemia, inferior leads Prolonged QT interval Confirmed by Regan Lemming ([id number]) on 06/14/2021 6:56:37 PM  Radiology DG Chest Portable 1 View  Result Date: 06/14/2021 CLINICAL DATA:  Shortness of breath EXAM: PORTABLE CHEST 1 VIEW COMPARISON:  None. FINDINGS: Heart size and mediastinal contours are within normal limits. No suspicious pulmonary opacities identified. No pleural effusion or pneumothorax visualized. No acute osseous abnormality appreciated. IMPRESSION: No acute intrathoracic process identified. Electronically Signed   By: Ofilia Neas M.D.   On: 06/14/2021 14:20    Procedures .Critical Care Performed by: Regan Lemming, MD Authorized by: Regan Lemming, MD   Critical care provider statement:    Critical care time (minutes):  42   Critical care was necessary to treat or prevent  imminent or life-threatening deterioration of the following conditions:  Metabolic crisis   Critical care was time spent personally by me on the following activities:  Evaluation of patient's response to treatment, examination of patient, obtaining  history from patient or surrogate, ordering and performing treatments and interventions, ordering and review of laboratory studies, ordering and review of radiographic studies, pulse oximetry, re-evaluation of patient's condition and review of old charts   Care discussed with: admitting provider     Medications Ordered in ED Medications  potassium chloride 10 mEq in 100 mL IVPB (0 mEq Intravenous Stopped 06/14/21 1837)  guaiFENesin (ROBITUSSIN) 100 MG/5ML liquid 5 mL (has no administration in time range)  enoxaparin (LOVENOX) injection 40 mg (has no administration in time range)  acetaminophen (TYLENOL) tablet 650 mg (has no administration in time range)    Or  acetaminophen (TYLENOL) suppository 650 mg (has no administration in time range)  senna-docusate (Senokot-S) tablet 1 tablet (has no administration in time range)  ondansetron (ZOFRAN) tablet 4 mg (has no administration in time range)    Or  ondansetron (ZOFRAN) injection 4 mg (has no administration in time range)  albuterol (PROVENTIL) (2.5 MG/3ML) 0.083% nebulizer solution 3 mL (has no administration in time range)  Eslicarbazepine Acetate TABS 400 mg (has no administration in time range)  lacosamide (VIMPAT) tablet 200 mg (has no administration in time range)  melatonin tablet 5 mg (has no administration in time range)  methocarbamol (ROBAXIN) tablet 500 mg (has no administration in time range)  montelukast (SINGULAIR) chewable tablet 10 mg (has no administration in time range)  pantoprazole (PROTONIX) EC tablet 40 mg (has no administration in time range)  simvastatin (ZOCOR) tablet 20 mg (has no administration in time range)  mometasone-formoterol (DULERA) 200-5 MCG/ACT inhaler 2 puff  (has no administration in time range)  predniSONE (DELTASONE) tablet 40 mg (has no administration in time range)  ipratropium-albuterol (DUONEB) 0.5-2.5 (3) MG/3ML nebulizer solution 3 mL (3 mLs Nebulization Given 06/14/21 1441)  potassium chloride SA (KLOR-CON) CR tablet 40 mEq (40 mEq Oral Given 06/14/21 1533)  methylPREDNISolone sodium succinate (SOLU-MEDROL) 125 mg/2 mL injection 125 mg (125 mg Intravenous Given 06/14/21 1722)  sodium chloride 0.9 % bolus 500 mL (0 mLs Intravenous Stopped 06/14/21 1841)    ED Course  I have reviewed the triage vital signs and the nursing notes.  Pertinent labs & imaging results that were available during my care of the patient were reviewed by me and considered in my medical decision making (see chart for details).  Clinical Course as of 06/14/21 1901  Nancy Fetter Jun 14, 2021  1542 Influenza A By PCR(!): POSITIVE [JL]    Clinical Course User Index [JL] Regan Lemming, MD   MDM Rules/Calculators/A&P                           65 year old female presenting to the emergency department with a chief complaint of lightheadedness.  The patient states that for the last 5 days she has had flulike symptoms to include fatigue, myalgias, lightheadedness, mild shortness of breath.  She has a history of COPD and has been using home albuterol with no significant improvement.  She does report a recent sick contact.  She denies any chest pain.  She denies any focal neurologic deficits.  She does endorse feeling unsteady on her feet on attempts at standing due to lightheadedness.  No syncope.  On arrival, the patient was afebrile, hemodynamically stable, saturating well on room air.  Patient physical exam significant for mild diffuse expiratory wheezing.  Patient presenting with 5 days of viral URI symptoms in the setting of known COPD.  Considered influenza versus other viral URI versus bacterial  pneumonia.  Additionally, patient feels weak and fatigued and has near syncope  considered orthostatic hypotension versus cardiac arrhythmia.  Additionally considered electrolyte abnormality as etiology of her presentation.  Lower suspicion for acute CVA.  Initial EKG significant for ventricular rate 81, normal sinus rhythm, long QTC with a QTC of 597, some ST segment depressions in the inferior leads with no ST segment elevations.  Orthostatics unable to fully completed as the patient became lightheaded on attempts to stand however no significant changes in blood pressure or heart rate were noted.  Patient with wheezing on initial arrival, albuterol nebulizer administered.  COVID-19 and influenza PCR collected.  A chest x-ray revealed no evidence of bacterial pneumonia no other acute cardiopulmonary process.  Influenza PCR testing resulted positive for influenza.  Patient administered IV Solu-Medrol for treatment of mild COPD exacerbation in the setting of her influenza infection.  The patient's laboratory work-up resulted positive for hypokalemia to 2.0.  Supplementation was administered orally and IV.  The patient was unfortunately status post 1 DuoNeb treatment which likely shifted her potassium further.  Her magnesium resulted normal at 1.7.  She continued potassium supplementation.  She was also found to be hyponatremic to 123 from the last measurement of around 130.  No clear leukocytosis, negative D-dimer, normal troponin.  Low concern for ACS.  Unlikely PE.  Symptoms of weakness and unsteadiness potentially due to multiple electrolyte abnormalities, positive influenza test.  Internal medicine consulted for admission and the patient was subsequently admitted.  Following admission, the patient had an episode of lightheadedness on standing and brief episode of hypotension for which an IV fluid bolus was administered.  Patient notably had improvement in her blood pressure following fluid resuscitation.  Final Clinical Impression(s) / ED Diagnoses Final diagnoses:  Influenza A   COPD exacerbation (HCC)  Hypokalemia  Lightheadedness    Rx / DC Orders ED Discharge Orders     None        Ernie Avena, MD 06/14/21 1901

## 2021-06-14 NOTE — Hospital Course (Addendum)
Multiple family members sick at home with the flu She has had lightheadedness since 5 days ago.

## 2021-06-14 NOTE — ED Triage Notes (Signed)
C/o feeling lightheaded and generalized weakness x 5 days.  Denies pain.

## 2021-06-14 NOTE — ED Notes (Signed)
Pt does not have a cardiac pacemaker to interrogate.

## 2021-06-14 NOTE — ED Notes (Signed)
This RN attempted to call report to 5W. 

## 2021-06-14 NOTE — ED Notes (Signed)
Dr. Karene Fry informed of potassium of 2

## 2021-06-14 NOTE — ED Provider Notes (Signed)
Emergency Medicine Provider Triage Evaluation Note  Angela Giles , a 65 y.o. female  was evaluated in triage.  Pt complains of near syncope.  Review of Systems  Positive: Cold sxs, fatigue, near syncope Negative: Sob, productive cough, dysuria  Physical Exam  BP 125/66   Pulse 79   Temp 99 F (37.2 C) (Oral)   Resp (!) 22   SpO2 99%  Gen:   Awake, no distress   Resp:  Normal effort  MSK:   Moves extremities without difficulty  Other:    Medical Decision Making  Medically screening exam initiated at 12:28 PM.  Appropriate orders placed.  CAITLAN CHAUCA was informed that the remainder of the evaluation will be completed by another provider, this initial triage assessment does not replace that evaluation, and the importance of remaining in the ED until their evaluation is complete.  For the past 5 days pt has had cold sxs, fatigue and lightheadedness.  Recent sick contact.   Fayrene Helper, PA-C 06/14/21 1232    Terald Sleeper, MD 06/14/21 [phone]

## 2021-06-14 NOTE — ED Notes (Signed)
Attempted report to 5W. 

## 2021-06-14 NOTE — ED Notes (Signed)
Pt  called out stating she felt like she was gong to pass out then proceeded to state that she felt this way prior to a seizure coming on . Seizure pads placed, MD made aware pt. Placed on 2L fluids ordered.
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
## 2021-06-14 NOTE — Plan of Care (Signed)
  Problem: Clinical Measurements: Goal: Ability to maintain clinical measurements within normal limits will improve Outcome: Progressing Goal: Diagnostic test results will improve Outcome: Progressing   

## 2021-06-15 ENCOUNTER — Other Ambulatory Visit (HOSPITAL_COMMUNITY): Payer: Self-pay

## 2021-06-15 DIAGNOSIS — J101 Influenza due to other identified influenza virus with other respiratory manifestations: Secondary | ICD-10-CM | POA: Diagnosis not present

## 2021-06-15 LAB — BASIC METABOLIC PANEL
Anion gap: 8 (ref 5–15)
BUN: 8 mg/dL (ref 8–23)
CO2: 27 mmol/L (ref 22–32)
Calcium: 9.1 mg/dL (ref 8.9–10.3)
Chloride: 94 mmol/L — ABNORMAL LOW (ref 98–111)
Creatinine, Ser: 0.67 mg/dL (ref 0.44–1.00)
GFR, Estimated: >60 mL/min (ref >60)
Glucose, Bld: 130 mg/dL — ABNORMAL HIGH (ref 70–99)
Potassium: 3.8 mmol/L (ref 3.5–5.1)
Sodium: 129 mmol/L — ABNORMAL LOW (ref 135–145)

## 2021-06-15 LAB — CBC
HCT: 35.4 % — ABNORMAL LOW (ref 36.0–46.0)
Hemoglobin: 13.2 g/dL (ref 12.0–15.0)
MCH: 29.1 pg (ref 26.0–34.0)
MCHC: 37.3 g/dL — ABNORMAL HIGH (ref 30.0–36.0)
MCV: 78 fL — ABNORMAL LOW (ref 80.0–100.0)
Platelets: 356 10*3/uL (ref 150–400)
RBC: 4.54 MIL/uL (ref 3.87–5.11)
RDW: 12.1 % (ref 11.5–15.5)
WBC: 4.5 10*3/uL (ref 4.0–10.5)
nRBC: 0 % (ref 0.0–0.2)

## 2021-06-15 MED ORDER — GUAIFENESIN 100 MG/5ML PO LIQD
5.0000 mL | ORAL | 0 refills | Status: DC | PRN
  Filled 2021-06-15: qty 120, 4d supply, fill #0

## 2021-06-15 MED ORDER — PREDNISONE 20 MG PO TABS
40.0000 mg | ORAL_TABLET | Freq: Every day | ORAL | 0 refills | Status: AC
  Filled 2021-06-15: qty 8, 4d supply, fill #0

## 2021-06-15 MED ORDER — PREDNISONE 20 MG PO TABS: 40.0000 mg | ORAL_TABLET | Freq: Every day | ORAL | 0 refills | Status: DC

## 2021-06-15 MED ORDER — OSELTAMIVIR PHOSPHATE 75 MG PO CAPS
75.0000 mg | ORAL_CAPSULE | Freq: Two times a day (BID) | ORAL | 0 refills | Status: AC
  Filled 2021-06-15: qty 9, 5d supply, fill #0

## 2021-06-15 MED ORDER — OSELTAMIVIR PHOSPHATE 75 MG PO CAPS
75.0000 mg | ORAL_CAPSULE | Freq: Two times a day (BID) | ORAL | Status: DC
  Administered 2021-06-15: 12:00:00 75 mg via ORAL
  Filled 2021-06-15 (×2): qty 1

## 2021-06-15 MED ORDER — OSELTAMIVIR PHOSPHATE 75 MG PO CAPS
75.0000 mg | ORAL_CAPSULE | Freq: Two times a day (BID) | ORAL | 0 refills | Status: DC
Start: 2021-06-15 — End: 2021-06-15

## 2021-06-15 MED ORDER — GUAIFENESIN 100 MG/5ML PO LIQD: 5.0000 mL | ORAL | 0 refills | Status: DC | PRN

## 2021-06-15 MED ORDER — SODIUM CHLORIDE 0.9 % IV SOLN
INTRAVENOUS | Status: DC
Start: 2021-06-15 — End: 2021-06-15

## 2021-06-15 NOTE — Progress Notes (Signed)
CSW received request to assist with Medicaid application. CSW emailed financial counseling; they will reach out to patient at home to complete application if patient qualifies. CSW attempted to call patient to make her aware but no answer.   Joaquin Courts, MSW, [facility]

## 2021-06-15 NOTE — Progress Notes (Signed)
HD#0 Subjective:  Overnight Events: NAEO   Feeling less dizzy and lightheaded today. She has been able to eat a little bit. Did feel somewhat lightheaded when using the bathroom but did not feel like she was going to pass out. She is not sure if she is going to feel comfortable going home today, she wants to see how she feels in the next couple of hours.   Objective:  Vital signs in last 24 hours: Vitals:   06/14/21 1944 06/14/21 2014 06/14/21 2331 06/15/21 0353  BP:  (!) 155/64 (!) 124/57 109/85  Pulse:  80 69 80  Resp:  15 20 (!) 23  Temp:  97.9 F (36.6 C) (!) 97.5 F (36.4 C) 98 F (36.7 C)  TempSrc:  Oral Oral Oral  SpO2:  99% 98% 99%  Weight: 65 kg     Height: 5' (1.524 m)      Supplemental O2: Room Air SpO2: 99 %   Physical Exam:  Constitutional:pleasant woman resting comfortably in bed, in no acute distress  Eyes: conjunctiva non-erythematous Cardiovascular: regular rate and rhythm, no m/r/g Pulmonary/Chest: normal work of breathing on room air, significantly less wheezing than prior exam Abdominal: soft, non-tender, non-distended MSK: normal bulk and tone Neurological: alert & oriented x 3, answering questions appropriately Skin: warm and dry Psych: normal affect    Filed Weights   06/14/21 1944  Weight: 65 kg     Intake/Output Summary (Last 24 hours) at 06/15/2021 0717 Last data filed at 06/15/2021 0550 Gross per 24 hour  Intake 3295.2 ml  Output 1100 ml  Net 2195.2 ml   Net IO Since Admission: 2,195.2 mL [06/15/21 0717]  Pertinent Labs: CBC Latest Ref Rng & Units 06/14/2021 03/14/2021  WBC 4.0 - 10.5 K/uL 6.8 8.3  Hemoglobin 12.0 - 15.0 g/dL 15.1(H) 12.9  Hematocrit 36.0 - 46.0 % 40.6 37.4  Platelets 150 - 400 K/uL 361 334    CMP Latest Ref Rng & Units 06/14/2021 06/14/2021 03/14/2021  Glucose 70 - 99 mg/dL 231(H) 143(H) 111(H)  BUN 8 - 23 mg/dL 9 10 15   Creatinine 0.44 - 1.00 mg/dL 0.97 0.80 0.87  Sodium 135 - 145 mmol/L 124(L) 123(L)  130(L)  Potassium 3.5 - 5.1 mmol/L 2.3(LL) 2.0(LL) 4.0  Chloride 98 - 111 mmol/L 86(L) 78(L) 92(L)  CO2 22 - 32 mmol/L 24 32 26  Calcium 8.9 - 10.3 mg/dL 9.0 9.2 9.2    Imaging: DG Chest Portable 1 View  Result Date: 06/14/2021 CLINICAL DATA:  Shortness of breath EXAM: PORTABLE CHEST 1 VIEW COMPARISON:  None. FINDINGS: Heart size and mediastinal contours are within normal limits. No suspicious pulmonary opacities identified. No pleural effusion or pneumothorax visualized. No acute osseous abnormality appreciated. IMPRESSION: No acute intrathoracic process identified. Electronically Signed   By: Ofilia Neas M.D.   On: 06/14/2021 14:20    Assessment/Plan:   Principal Problem:   Influenza due to influenza A virus   Patient Summary: Angela Giles is a 65 y.o. with pertinent PMH of hypertension, hyperlipidemia, COPD, chronic back pain, temporal lobe epilepsy,  who presented with weakness found to have severe hypokalemia in the setting of influenza A.   #Acute on chronic respiratory failure exacerbation 2/2 influenza A #COPD Patient saturating well on room air, looks more comfortable than yesterday and WBC has remained WNL and patient has not had any fevers. Will make sure patient is able to tolerate PO, ambulate, and check orthostatics but patient may be able to go home  today.\ - f/u orthostatics - s/p solumedrol; 40 mg prednisone daily for 5 days - 5 day course of tamiflu end 12/2 - dulera, duonebs, albuterol inhalers - guaifenesin for mucous - telemetry   #Hypokalemia; resolved #hyponatremia; improving Patient's potassium low at 2.0 and sodium low at 123. Suspect patient has had poor solute intake 2/2 influenza. This slightly improved to 2.3 on PM recheck. Now WNL this morning at 3.8. Na has improved to 129. - f/u morning BMP - repleting K - daily bmp   #HTN Patient had labile blood pressures on admission, holding home HTN medications.    #Epilepsy Continue home  medications of Eslicarbazine 400 daily and vimpat 200 BID.    #GERD Continue home protonix 40 daily   #Chronic back pain Paged about pain overnight, gave oxy 2.5 Q6 -continue home robaxin 500 BID   #HLD -continue home simvastatin 20 daily   Diet: Normal VTE: Enoxaparin IVF: None,None Code: Full   Prior to Admission Living Arrangement: Home, living with family Anticipated Discharge Location: Home Barriers to Discharge: Medical treatment  Ilene Qua, MD Internal Medicine Resident PGY-1 Pager [phone] Please contact the on call pager after 5 pm and on weekends at [phone].

## 2021-06-15 NOTE — Discharge Summary (Addendum)
Name: Angela Giles MRN: [id number] DOB: 1956/01/13 65 y.o. PCP: Pcp, No  Date of Admission: 06/14/2021 12:22 PM Date of Discharge: 06/15/21 Attending Physician: Angela Lagos, MD  Discharge Diagnosis: 1. Acute on chronic respiratory failure 2/2 influenza A; COPD 2. Hypokalemia; resolved 3. Hyponatremia; improved  4. HTN 5. Epilepsy 6. GERD 7. Chronic back pain 8. HLD  Discharge Medications: Allergies as of 06/15/2021       Reactions   Beef-derived Products    Fish Allergy    Milk-related Compounds         Medication List     STOP taking these medications    amLODipine 10 MG tablet Commonly known as: NORVASC   cyclobenzaprine 10 MG tablet Commonly known as: FLEXERIL   hydrochlorothiazide 25 MG tablet Commonly known as: HYDRODIURIL   oxyCODONE-acetaminophen 5-325 MG tablet Commonly known as: PERCOCET/ROXICET       TAKE these medications    albuterol 108 (90 Base) MCG/ACT inhaler Commonly known as: VENTOLIN HFA 1-2 puffs every 4 (four) hours as needed for shortness of breath or wheezing.   Aptiom 400 MG Tabs Generic drug: Eslicarbazepine Acetate Take 1 tablet (400 mg total) by mouth daily.   aspirin 81 MG EC tablet Take 81 mg by mouth daily. Swallow whole.   Atrovent HFA 17 MCG/ACT inhaler Generic drug: ipratropium Inhale 2 puffs into the lungs every 6 (six) hours as needed for wheezing.   diphenhydrAMINE 25 MG tablet Commonly known as: BENADRYL Take 25 mg by mouth every 6 (six) hours as needed.   furosemide 20 MG tablet Commonly known as: LASIX Take by mouth.   guaiFENesin 100 MG/5ML liquid Commonly known as: ROBITUSSIN Take 5 mLs by mouth every 4 (four) hours as needed for cough or to loosen phlegm.   ibuprofen 600 MG tablet Commonly known as: ADVIL Take 1 tablet (600 mg total) by mouth every 6 (six) hours as needed.   lacosamide 200 MG Tabs tablet Commonly known as: Vimpat Take 1 tablet (200 mg total) by mouth 2 (two) times  daily.   loratadine 10 MG tablet Commonly known as: CLARITIN Take 10 mg by mouth daily.   Melatonin 5 MG Caps Take 5 mg by mouth at bedtime as needed (sleep).   methocarbamol 500 MG tablet Commonly known as: ROBAXIN Take 1 tablet (500 mg total) by mouth 2 (two) times daily as needed for muscle spasms.   montelukast 5 MG chewable tablet Commonly known as: SINGULAIR Chew 10 mg by mouth at bedtime.   omeprazole 20 MG capsule Commonly known as: PRILOSEC Take 20 mg by mouth daily.   oseltamivir 75 MG capsule Commonly known as: TAMIFLU Take 1 capsule (75 mg total) by mouth 2 (two) times daily for 9 doses.   predniSONE 20 MG tablet Commonly known as: DELTASONE Take 2 tablets (40 mg total) by mouth daily with breakfast for 4 days.   simvastatin 20 MG tablet Commonly known as: ZOCOR Take 20 mg by mouth daily at 6 PM.        Disposition and follow-up:   Ms.Angela Giles was discharged from [facility] in Stable condition.  At the hospital follow up visit please address:  1.  Acute on chronic respiratory failure 2/2 influenza A; COPD- ensure patient is back to baseline and continues to do well with home inhalers/breathing treatments  2. Hyponatremia/hypokalemia- recheck electrolytes  3. HTN- Held home medications upon discharge secondary to soft Bps. Titrate at your discretion   4. Epilepsy-  make sure patient continues to follow with neurology  2.  Labs / imaging needed at time of follow-up: bmp  3.  Pending labs/ test needing follow-up: none  Follow-up Appointments:  Our clinic will call you to schedule a follow up appointment in about a week.   06/23/2021 12:45 PM (Arrive by 12:30 PM) Angela Berthold, DO   06/24/2021 10:00 AM Angela Pollen, MD [facility] Course by problem list: 1. #Acute on chronic respiratory failure exacerbation 2/2 influenza A #COPD Patient presented with dizziness and lightheadedness and found to  be positive for influenza A. She was saturating well on room air, occasionally desaturating when coughing but oxygen recovered back to normal limits. WBC remained WNL.  CXR did not show any consolidation and patient did not receive any antibiotics since suspicion for a superimposed bacterial pneumonia was low.  Given her underlying COPD and the wheezing heard on exam she was given IV solumedrol and then continued on prednisone as well as dulera, duonebs, albuterol inhalers, and guaifenesin for mucous. By 11/28  her breathing had improved and her orthostatic vitals were normal . She was discharged on a 5 day course of tamiflu to end 12/2.   #Hypokalemia #hyponatremia Patient's potassium low at 2.0 and sodium low at 123. Suspect patient has had poor solute intake 2/2 influenza. This was repleted orally and IV and checked with daily BMPs. Her K was within normal limits upon discharge. Na improved to 129.    #HTN Patient had labile blood pressures on admission, held home HTN medications on admission. Will defer to PCP in the outpatient setting to adjust these.    #Epilepsy Continued home medications of Eslicarbazine [id number] daily and vimpat 200 BID.     #GERD Continued home protonix 40 daily   #Chronic back pain Continued home robaxin 500 BID, patient also had low dose oxy 2.5 Q6 PRN while admitted. She can discuss further opioid prescriptions with PCP as an outpatient.    #HLD Continued home simvastatin 20 daily  Discharge Exam:   BP 122/63 (BP Location: Right Arm)   Pulse 74   Temp 97.8 F (36.6 C) (Oral)   Resp 15   Ht 5' (1.524 m)   Wt 65 kg   SpO2 100%   BMI 27.99 kg/m  Discharge exam:  Constitutional:pleasant woman resting comfortably in bed, in no acute distress  Eyes: conjunctiva non-erythematous Cardiovascular: regular rate and rhythm, no m/r/g Pulmonary/Chest: normal work of breathing on room air, less wheezing than on prior examination Abdominal: soft, non-tender,  non-distended MSK: normal bulk and tone Neurological: alert & oriented x 3, answering questions appropriately Skin: warm and dry Psych: normal affect   Pertinent Labs, Studies, and Procedures:  DG Chest Portable 1 View  Result Date: 06/14/2021 CLINICAL DATA:  Shortness of breath EXAM: PORTABLE CHEST 1 VIEW COMPARISON:  None. FINDINGS: Heart size and mediastinal contours are within normal limits. No suspicious pulmonary opacities identified. No pleural effusion or pneumothorax visualized. No acute osseous abnormality appreciated. IMPRESSION: No acute intrathoracic process identified. Electronically Signed   By: Ofilia Neas M.D.   On: 06/14/2021 14:20    CBC Latest Ref Rng & Units 06/15/2021 06/14/2021 03/14/2021  WBC 4.0 - 10.5 K/uL 4.5 6.8 8.3  Hemoglobin 12.0 - 15.0 g/dL 13.2 15.1(H) 12.9  Hematocrit 36.0 - 46.0 % 35.4(L) 40.6 37.4  Platelets 150 - 400 K/uL 356 361 334   BMP Latest Ref Rng & Units 06/15/2021 06/14/2021 06/14/2021  Glucose  70 - 99 mg/dL 130(H) 231(H) 143(H)  BUN 8 - 23 mg/dL 8 9 10   Creatinine 0.44 - 1.00 mg/dL 0.67 0.97 0.80  Sodium 135 - 145 mmol/L 129(L) 124(L) 123(L)  Potassium 3.5 - 5.1 mmol/L 3.8 2.3(LL) 2.0(LL)  Chloride 98 - 111 mmol/L 94(L) 86(L) 78(L)  CO2 22 - 32 mmol/L 27 24 32  Calcium 8.9 - 10.3 mg/dL 9.1 9.0 9.2     Discharge Instructions: Evelyna Stege  You were recently admitted to [facility] for the flu. We gave you breathing treatments, IV fluids, and medications to help your cough. You can continue to take your home medications with the following changes.  Stop taking your norvasc and your HCTZ- follow up with [place] in clinic to discuss adding these back on. 2.  Start taking tamiflu- this will help you recover from the flu faster. Take this twice a day for a total of five days (finishing 12/2) 3.  Start taking prednisone for a total of 5 days, ending 12/2.  If you start to have increasing shortness of breath, dizziness, or  confusion we recommend that you seek further medical care.  Our clinic will call you to schedule a follow up appointment in the next week to make sure you continue to feel well. We are so glad you are feeling better!  Signed: Scarlett Presto, MD 06/15/2021, 7:55 AM   Pager: [phone]

## 2021-06-15 NOTE — Discharge Instructions (Addendum)
Angela Giles  You were recently admitted to [facility] for the flu. We gave you breathing treatments, IV fluids, and medications to help your cough. You can continue to take your home medications with the following changes.  Stop taking your norvasc and your HCTZ- follow up with [place] in clinic to discuss adding these back on. 2.  Start taking tamiflu- this will help you recover from the flu faster. Take this twice a day for a total of five days (finishing 12/2) 3.  Start taking prednisone for a total of 5 days, ending 12/2.  If you start to have increasing shortness of breath, dizziness, or confusion we recommend that you seek further medical care.  Our clinic will call you to schedule a follow up appointment in the next week to make sure you continue to feel well. We are so glad you are feeling better!

## 2021-06-15 NOTE — Progress Notes (Signed)
Date: 06/15/2021  Patient name: Angela Giles  Medical record number: [id number]  Date of birth: 1956/05/15   I have seen and evaluated Theodosia Blender and discussed their care with the Residency Team.  In brief, patient is a 65 year old female with past medical Struve hypertension, hyperlipidemia, COPD, temporal lobe epilepsy, chronic back pain who presented to the ED with worsening lightheadedness over the last 5 days.  Patient states that over the last 5 days she has noted worsening shortness of breath with cough productive of greenish phlegm and nasal congestion.  She is also noted worsening lightheadedness and has had to hold onto a wall or even crawl to avoid falling.  She denies any syncope.  No chest pain, no palpitations, no abdominal pain, no nausea or vomiting, no headache, no blurry vision, no focal weakness.  Patient did complain of some subjective fevers and chills as well as decreased appetite over the last 5 days.  Feels that 1 episode of diarrhea a couple days prior to admission.  Today, patient states that she feels much better and that her lightheadedness has improved.  She did feel little lightheaded when ambulating but much better than prior.  PMHx, Fam Hx, and/or Soc Hx : As per resident admit note  Vitals:   06/15/21 0746 06/15/21 1215  BP: 122/63 129/65  Pulse: 74 79  Resp: 15 16  Temp: 97.8 F (36.6 C) 97.8 F (36.6 C)  SpO2: 100% 97%   General: Awake, alert, oriented x3, NAD CVs: Regular rate and rhythm, normal heart sounds Lungs: Scattered bilateral wheezes noted on exam Abdomen: Soft, nontender, nondistended, normoactive bowel sounds Extremities: No edema noted, nontender to palpation Psych: Normal mood and affect HEENT: Normocephalic, atraumatic Skin: Warm and dry  Assessment and Plan: I have seen and evaluated the patient as outlined above. I agree with the formulated Assessment and Plan as detailed in the residents' note, with the following changes:    1.  Acute on chronic COPD exacerbation secondary to influenza A infection: -Patient presented to ED with lightheadedness, subjective fevers and chills, productive cough and worsening shortness of breath for 5 days prior to admission and was found to be flu positive.  Patient was noted to be hypotensive in the ED with SBP's as low as the 60s and 70s.  Patient's systolic blood pressure has improved to the 120s today and her O2 sats have remained in the 90s on room air.  She was noted to have wheezes on exam as well in the setting of a history of COPD consistent with an acute on chronic COPD exacerbation. -Patient given a dose of Solu-Medrol in the ED with improvement in her breathing and lung exam today with only mild scattered wheezes.  We will complete a 5-day course of prednisone 40 mg daily to treat her acute COPD exacerbation. -We will complete a 5-day course of Tamiflu for her influenza A -Patient was noted to have hyponatremia of 123 likely secondary to hypovolemia in the setting of decreased oral intake secondary to her influenza A infection.  Patient sodium has improved to 129 today.  Patient is now tolerating oral intake.  No further work-up at this time.  I suspect hyponatremia should improve with continued oral intake -Patient was also noted to be severely hypokalemic with potassium down to 2 on admission.  This is now resolved and repeat potassium was morning was 3.8.  No further work-up at this time -No further work-up for now.  Patient stable for DC home  today.  Earl Lagos, MD 11/28/20222:19 PM

## 2021-06-16 MED ORDER — LACOSAMIDE 200 MG PO TABS
200.0000 mg | ORAL_TABLET | Freq: Two times a day (BID) | ORAL | 5 refills | Status: DC
Start: 2021-06-16 — End: 2021-12-22

## 2021-06-16 MED ORDER — APTIOM 400 MG PO TABS: 1.0000 | ORAL_TABLET | Freq: Every day | ORAL | 11 refills | Status: DC

## 2021-06-23 ENCOUNTER — Encounter: Payer: Medicare Other | Admitting: Neurology

## 2021-06-24 ENCOUNTER — Ambulatory Visit: Payer: Medicaid - Out of State | Admitting: Emergency Medicine

## 2021-07-28 ENCOUNTER — Ambulatory Visit (INDEPENDENT_AMBULATORY_CARE_PROVIDER_SITE_OTHER): Payer: Self-pay | Admitting: Neurology

## 2021-07-28 ENCOUNTER — Other Ambulatory Visit: Payer: Self-pay

## 2021-07-28 DIAGNOSIS — R202 Paresthesia of skin: Secondary | ICD-10-CM

## 2021-07-28 NOTE — Procedures (Signed)
[facility]  [address] [place], [address]  [place], [place] [address] Tel: [phone] Fax:  [phone] Test Date:  07/28/2021  Patient: Angela Giles DOB: 01-07-1956 Physician: Nita Sickle, DO  Sex: Female Height: 5' " Ref Phys: Nita Sickle, DO  ID#: [id number]   Technician:    Patient Complaints: This is a 66 year old female referred for evaluation of bilateral feet paresthesias.  NCV & EMG Findings: Electrodiagnostic testing of the right lower extremity and additional studies of the left shows: Bilateral sural and superficial peroneal sensory responses are within normal limits. Bilateral peroneal and tibial motor responses are within normal limits. Bilateral tibial H reflex studies are within normal limits. There is no evidence of active or chronic motor axonal changes affecting any of the tested muscles.  Motor unit configuration and recruitment pattern is within normal limits.  Impression: This is a normal study of the lower extremities.  In particular, there is no evidence of a sensorimotor polyneuropathy or lumbosacral radiculopathy.     ___________________________ Nita Sickle, DO    Nerve Conduction Studies Anti Sensory Summary Table   Stim Site NR Peak (ms) Norm Peak (ms) P-T Amp (V) Norm P-T Amp  Left Sup Peroneal Anti Sensory (Ant Lat Mall)  32C  12 cm    2.8 <4.6 6.4 >3  Right Sup Peroneal Anti Sensory (Ant Lat Mall)  32C  12 cm    2.7 <4.6 8.0 >3  Left Sural Anti Sensory (Lat Mall)  32C  Calf    3.3 <4.6 14.7 >3  Right Sural Anti Sensory (Lat Mall)  32C  Calf    2.4 <4.6 14.1 >3   Motor Summary Table   Stim Site NR Onset (ms) Norm Onset (ms) O-P Amp (mV) Norm O-P Amp Site1 Site2 Delta-0 (ms) Dist (cm) Vel (m/s) Norm Vel (m/s)  Left Peroneal Motor (Ext Dig Brev)  32C  Ankle    4.3 <6.0 5.8 >2.5 B Fib Ankle 7.7 32.0 42 >40  B Fib    12.0  5.6  Poplt B Fib 1.7 7.0 41 >40  Poplt    13.7  5.5         Right Peroneal Motor (Ext Dig Brev)   32C  Ankle    5.7 <6.0 4.4 >2.5 B Fib Ankle 7.8 31.0 40 >40  B Fib    13.5  4.4  Poplt B Fib 1.6 7.0 44 >40  Poplt    15.1  4.1         Left Tibial Motor (Abd Hall Brev)  32C  Ankle    5.6 <6.0 9.8 >4 Knee Ankle 9.3 38.0 41 >40  Knee    14.9  7.5         Right Tibial Motor (Abd Hall Brev)  32C  Ankle    4.1 <6.0 12.8 >4 Knee Ankle 9.1 38.0 42 >40  Knee    13.2  9.0          H Reflex Studies   NR H-Lat (ms) Lat Norm (ms) L-R H-Lat (ms)  Left Tibial (Gastroc)  32C     33.74 <35 0.00  Right Tibial (Gastroc)  32C     33.74 <35 0.00   EMG   Side Muscle Ins Act Fibs Psw Fasc Number Recrt Dur Dur. Amp Amp. Poly Poly. Comment  Right AntTibialis Nml Nml Nml Nml Nml Nml Nml Nml Nml Nml Nml Nml N/A  Right Gastroc Nml Nml Nml Nml Nml Nml Nml Nml Nml Nml Nml  Nml N/A  Right Flex Dig Long Nml Nml Nml Nml Nml Nml Nml Nml Nml Nml Nml Nml N/A  Right RectFemoris Nml Nml Nml Nml Nml Nml Nml Nml Nml Nml Nml Nml N/A  Right GluteusMed Nml Nml Nml Nml Nml Nml Nml Nml Nml Nml Nml Nml N/A  Left AntTibialis Nml Nml Nml Nml Nml Nml Nml Nml Nml Nml Nml Nml N/A  Left Gastroc Nml Nml Nml Nml Nml Nml Nml Nml Nml Nml Nml Nml N/A  Left Flex Dig Long Nml Nml Nml Nml Nml Nml Nml Nml Nml Nml Nml Nml N/A  Left RectFemoris Nml Nml Nml Nml Nml Nml Nml Nml Nml Nml Nml Nml N/A  Left GluteusMed Nml Nml Nml Nml Nml Nml Nml Nml Nml Nml Nml Nml N/A      Waveforms:

## 2021-09-28 ENCOUNTER — Encounter: Payer: Self-pay | Admitting: Emergency Medicine

## 2021-09-28 ENCOUNTER — Other Ambulatory Visit: Payer: Self-pay

## 2021-09-28 ENCOUNTER — Ambulatory Visit (INDEPENDENT_AMBULATORY_CARE_PROVIDER_SITE_OTHER): Payer: Self-pay | Admitting: Emergency Medicine

## 2021-09-28 VITALS — BP 136/72 | HR 71 | Temp 98.4°F | Ht 60.0 in | Wt 144.0 lb

## 2021-09-28 DIAGNOSIS — J449 Chronic obstructive pulmonary disease, unspecified: Secondary | ICD-10-CM

## 2021-09-28 DIAGNOSIS — Z8669 Personal history of other diseases of the nervous system and sense organs: Secondary | ICD-10-CM | POA: Insufficient documentation

## 2021-09-28 DIAGNOSIS — I10 Essential (primary) hypertension: Secondary | ICD-10-CM

## 2021-09-28 DIAGNOSIS — M545 Low back pain, unspecified: Secondary | ICD-10-CM

## 2021-09-28 DIAGNOSIS — G8929 Other chronic pain: Secondary | ICD-10-CM | POA: Insufficient documentation

## 2021-09-28 DIAGNOSIS — G894 Chronic pain syndrome: Secondary | ICD-10-CM | POA: Insufficient documentation

## 2021-09-28 DIAGNOSIS — Z7689 Persons encountering health services in other specified circumstances: Secondary | ICD-10-CM

## 2021-09-28 DIAGNOSIS — G4700 Insomnia, unspecified: Secondary | ICD-10-CM

## 2021-09-28 MED ORDER — HYDROXYZINE HCL 25 MG PO TABS: 25.0000 mg | ORAL_TABLET | Freq: Every evening | ORAL | 1 refills | Status: DC | PRN

## 2021-09-28 NOTE — Assessment & Plan Note (Signed)
Stable.  Continue Symbicort and albuterol as needed. 

## 2021-09-28 NOTE — Progress Notes (Signed)
Angela Giles 66 y.o.   Chief Complaint  Patient presents with   New Patient (Initial Visit)   Medication Management    Questions about her albuterol     HISTORY OF PRESENT ILLNESS: This is a 66 y.o. female first visit to this office, wants to establish care with me. Recently moved to the area from [place]. Has the following chronic medical problems: 1.  Chronic lumbar pain and chronic pain syndrome.  States that she has been on oxycodone for a long time.  Needs to establish care with local orthopedist. 2.  Essential hypertension 3.  History of seizures on medications.  Already establish care with a local neurologist 4.  History of COPD 5.  History of dyslipidemia No other complaints or medical concerns today.  HPI   Prior to Admission medications   Medication Sig Start Date End Date Taking? Authorizing Provider  APTIOM 400 MG TABS Take 1 tablet (400 mg total) by mouth daily. 06/16/21  Yes Van Clines, MD  aspirin 81 MG EC tablet Take 81 mg by mouth daily. Swallow whole.   Yes [provider]  furosemide (LASIX) 20 MG tablet Take by mouth. 05/08/21  Yes [provider]  guaiFENesin (ROBITUSSIN) 100 MG/5ML liquid Take 5 mLs by mouth every 4 (four) hours as needed for cough or to loosen phlegm. 06/15/21  Yes Demaio, Alexa, MD  hydrOXYzine (ATARAX) 25 MG tablet Take 1 tablet (25 mg total) by mouth at bedtime as needed. 09/28/21  Yes Benita Boonstra, Eilleen Kempf, MD  lacosamide (VIMPAT) 200 MG TABS tablet Take 1 tablet (200 mg total) by mouth 2 (two) times daily. 06/16/21  Yes Van Clines, MD  Melatonin 5 MG CAPS Take 5 mg by mouth at bedtime as needed (sleep).   Yes [provider]  montelukast (SINGULAIR) 5 MG chewable tablet Chew 10 mg by mouth at bedtime. 03/19/21  Yes [provider]  omeprazole (PRILOSEC) 20 MG capsule Take 20 mg by mouth daily.   Yes [provider]  simvastatin (ZOCOR) 20 MG tablet Take 20 mg by mouth daily at 6  PM. 05/08/21  Yes [provider]  albuterol (VENTOLIN HFA) 108 (90 Base) MCG/ACT inhaler 1-2 puffs every 4 (four) hours as needed for shortness of breath or wheezing. Patient not taking: Reported on 09/28/2021 05/08/21   [provider]  ibuprofen (ADVIL) 600 MG tablet Take 1 tablet (600 mg total) by mouth every 6 (six) hours as needed. Patient not taking: Reported on 09/28/2021 03/14/21   Tanda Rockers A, DO  ipratropium (ATROVENT HFA) 17 MCG/ACT inhaler Inhale 2 puffs into the lungs every 6 (six) hours as needed for wheezing. Patient not taking: Reported on 06/08/2021 03/14/21   Tanda Rockers A, DO  loratadine (CLARITIN) 10 MG tablet Take 10 mg by mouth daily. Patient not taking: Reported on 09/28/2021 12/11/20   [provider]  methocarbamol (ROBAXIN) 500 MG tablet Take 1 tablet (500 mg total) by mouth 2 (two) times daily as needed for muscle spasms. Patient not taking: Reported on 06/14/2021 03/14/21   Tanda Rockers A, DO    Allergies  Allergen Reactions   Beef-Derived Products    Fish Allergy    Milk-Related Compounds     There are no problems to display for this patient.   Past Medical History:  Diagnosis Date   Chronic back pain    COPD (chronic obstructive pulmonary disease) (HCC)    Dorsalgia    High cholesterol  Hypertension    Seizures (HCC)     Past Surgical History:  Procedure Laterality Date   ABDOMINAL HYSTERECTOMY     CHOLECYSTECTOMY      Social History   Socioeconomic History   Marital status: Divorced    Spouse name: Not on file   Number of children: 3   Years of education: Not on file   Highest education level: Not on file  Occupational History   Not on file  Tobacco Use   Smoking status: Former   Smokeless tobacco: Never  Vaping Use   Vaping Use: Never used  Substance and Sexual Activity   Alcohol use: Never   Drug use: Never   Sexual activity: Not on file  Other Topics Concern   Not on file  Social History Narrative    Lives in a one story home with a basement    Right handed    Social Determinants of Health   Financial Resource Strain: Not on file  Food Insecurity: Not on file  Transportation Needs: Not on file  Physical Activity: Not on file  Stress: Not on file  Social Connections: Not on file  Intimate Partner Violence: Not on file    Family History  Problem Relation Age of Onset   Dementia Mother      Review of Systems  Constitutional: Negative.  Negative for chills and fever.  HENT: Negative.  Negative for congestion and sore throat.   Respiratory: Negative.  Negative for cough and shortness of breath.   Cardiovascular: Negative.  Negative for chest pain and palpitations.  Gastrointestinal:  Negative for nausea and vomiting.  Genitourinary: Negative.   Musculoskeletal:  Positive for back pain.  Skin: Negative.  Negative for rash.  Neurological:  Negative for dizziness and headaches.  All other systems reviewed and are negative.  Today's Vitals   09/28/21 0946  BP: 136/72  Pulse: 71  Temp: 98.4 F (36.9 C)  TempSrc: Oral  SpO2: 97%  Weight: 144 lb (65.3 kg)  Height: 5' (1.524 m)   Body mass index is 28.12 kg/m.  Physical Exam Vitals reviewed.  Constitutional:      Appearance: Normal appearance. She is obese.  HENT:     Head: Normocephalic.  Eyes:     Extraocular Movements: Extraocular movements intact.     Pupils: Pupils are equal, round, and reactive to light.  Cardiovascular:     Rate and Rhythm: Normal rate and regular rhythm.     Pulses: Normal pulses.     Heart sounds: Normal heart sounds.  Pulmonary:     Effort: Pulmonary effort is normal.     Breath sounds: Normal breath sounds.  Musculoskeletal:     Cervical back: No tenderness.  Lymphadenopathy:     Cervical: No cervical adenopathy.  Skin:    General: Skin is warm and dry.     Capillary Refill: Capillary refill takes less than 2 seconds.  Neurological:     General: No focal deficit present.      Mental Status: She is alert and oriented to person, place, and time.  Psychiatric:        Mood and Affect: Mood normal.        Behavior: Behavior normal.     ASSESSMENT & PLAN: Problem List Items Addressed This Visit       Cardiovascular and Mediastinum   Essential hypertension    Well-controlled hypertension. BP Readings from Last 3 Encounters:  09/28/21 136/72  06/15/21 (!) 148/66  06/08/21 (!) 150/78  Continue amlodipine 5 mg daily.         Respiratory   Chronic obstructive pulmonary disease (HCC)    Stable.  Continue Symbicort and albuterol as needed.        Other   Chronic pain syndrome - Primary    Needs referral to pain management clinic. I made patient aware I do not feel comfortable treating chronic pain, particularly with opiates. She is aware I will not be her pain management doctor.      Relevant Orders   Ambulatory referral to Pain Clinic   History of seizure disorder    Well-controlled on present medications.  Continue follow-up with neurologist as scheduled.      Insomnia   Relevant Medications   hydrOXYzine (ATARAX) 25 MG tablet   Chronic bilateral low back pain without sciatica    Needs to establish with a local orthopedic doctor for evaluation.      Relevant Orders   Ambulatory referral to Orthopedic Surgery   Other Visit Diagnoses     Encounter to establish care          Patient Instructions  Health Maintenance, Female Adopting a healthy lifestyle and getting preventive care are important in promoting health and wellness. Ask your health care provider about: The right schedule for you to have regular tests and exams. Things you can do on your own to prevent diseases and keep yourself healthy. What should I know about diet, weight, and exercise? Eat a healthy diet  Eat a diet that includes plenty of vegetables, fruits, low-fat dairy products, and lean protein. Do not eat a lot of foods that are high in solid fats, added sugars, or  sodium. Maintain a healthy weight Body mass index (BMI) is used to identify weight problems. It estimates body fat based on height and weight. Your health care provider can help determine your BMI and help you achieve or maintain a healthy weight. Get regular exercise Get regular exercise. This is one of the most important things you can do for your health. Most adults should: Exercise for at least 150 minutes each week. The exercise should increase your heart rate and make you sweat (moderate-intensity exercise). Do strengthening exercises at least twice a week. This is in addition to the moderate-intensity exercise. Spend less time sitting. Even light physical activity can be beneficial. Watch cholesterol and blood lipids Have your blood tested for lipids and cholesterol at 64 years of age, then have this test every 5 years. Have your cholesterol levels checked more often if: Your lipid or cholesterol levels are high. You are older than 64 years of age. You are at high risk for heart disease. What should I know about cancer screening? Depending on your health history and family history, you may need to have cancer screening at various ages. This may include screening for: Breast cancer. Cervical cancer. Colorectal cancer. Skin cancer. Lung cancer. What should I know about heart disease, diabetes, and high blood pressure? Blood pressure and heart disease High blood pressure causes heart disease and increases the risk of stroke. This is more likely to develop in people who have high blood pressure readings or are overweight. Have your blood pressure checked: Every 3-5 years if you are 78-34 years of age. Every year if you are 34 years old or older. Diabetes Have regular diabetes screenings. This checks your fasting blood sugar level. Have the screening done: Once every three years after age 55 if you are at a normal weight and  have a low risk for diabetes. More often and at a younger  age if you are overweight or have a high risk for diabetes. What should I know about preventing infection? Hepatitis B If you have a higher risk for hepatitis B, you should be screened for this virus. Talk with your health care provider to find out if you are at risk for hepatitis B infection. Hepatitis C Testing is recommended for: Everyone born from [birth year] through 1963. Anyone with known risk factors for hepatitis C. Sexually transmitted infections (STIs) Get screened for STIs, including gonorrhea and chlamydia, if: You are sexually active and are younger than 64 years of age. You are older than 64 years of age and your health care provider tells you that you are at risk for this type of infection. Your sexual activity has changed since you were last screened, and you are at increased risk for chlamydia or gonorrhea. Ask your health care provider if you are at risk. Ask your health care provider about whether you are at high risk for HIV. Your health care provider may recommend a prescription medicine to help prevent HIV infection. If you choose to take medicine to prevent HIV, you should first get tested for HIV. You should then be tested every 3 months for as long as you are taking the medicine. Pregnancy If you are about to stop having your period (premenopausal) and you may become pregnant, seek counseling before you get pregnant. Take 400 to 800 micrograms (mcg) of folic acid every day if you become pregnant. Ask for birth control (contraception) if you want to prevent pregnancy. Osteoporosis and menopause Osteoporosis is a disease in which the bones lose minerals and strength with aging. This can result in bone fractures. If you are 52 years old or older, or if you are at risk for osteoporosis and fractures, ask your health care provider if you should: Be screened for bone loss. Take a calcium or vitamin D supplement to lower your risk of fractures. Be given hormone replacement therapy  (HRT) to treat symptoms of menopause. Follow these instructions at home: Alcohol use Do not drink alcohol if: Your health care provider tells you not to drink. You are pregnant, may be pregnant, or are planning to become pregnant. If you drink alcohol: Limit how much you have to: 0-1 drink a day. Know how much alcohol is in your drink. In the [place] one drink equals one 12 oz bottle of beer (355 mL), one 5 oz glass of wine (148 mL), or one 1 oz glass of hard liquor (44 mL). Lifestyle Do not use any products that contain nicotine or tobacco. These products include cigarettes, chewing tobacco, and vaping devices, such as e-cigarettes. If you need help quitting, ask your health care provider. Do not use street drugs. Do not share needles. Ask your health care provider for help if you need support or information about quitting drugs. General instructions Schedule regular health, dental, and eye exams. Stay current with your vaccines. Tell your health care provider if: You often feel depressed. You have ever been abused or do not feel safe at home. Summary Adopting a healthy lifestyle and getting preventive care are important in promoting health and wellness. Follow your health care provider's instructions about healthy diet, exercising, and getting tested or screened for diseases. Follow your health care provider's instructions on monitoring your cholesterol and blood pressure. This information is not intended to replace advice given to you by your health care provider. Make  sure you discuss any questions you have with your health care provider. Document Revised: 11/24/2020 Document Reviewed: 11/24/2020 Elsevier Patient Education  2022 Elsevier Inc.     Edwina Barth, MD [facility]

## 2021-09-28 NOTE — Assessment & Plan Note (Signed)
Needs to establish with a local orthopedic doctor for evaluation. ?

## 2021-09-28 NOTE — Assessment & Plan Note (Signed)
Needs referral to pain management clinic. ?I made patient aware I do not feel comfortable treating chronic pain, particularly with opiates. ?She is aware I will not be her pain management doctor. ?

## 2021-09-28 NOTE — Assessment & Plan Note (Signed)
Well-controlled hypertension. ?BP Readings from Last 3 Encounters:  ?09/28/21 136/72  ?06/15/21 (!) 148/66  ?06/08/21 (!) 150/78  ?Continue amlodipine 5 mg daily. ? ?

## 2021-09-28 NOTE — Assessment & Plan Note (Signed)
Well-controlled on present medications.  Continue follow-up with neurologist as scheduled. ?

## 2021-09-28 NOTE — Patient Instructions (Signed)

## 2021-10-09 ENCOUNTER — Emergency Department (HOSPITAL_COMMUNITY): Payer: Self-pay

## 2021-10-09 ENCOUNTER — Other Ambulatory Visit: Payer: Self-pay

## 2021-10-09 ENCOUNTER — Emergency Department (HOSPITAL_COMMUNITY)
Admission: EM | Admit: 2021-10-09 | Discharge: 2021-10-09 | Disposition: A | Discharge status: Home / Self Care | Payer: Self-pay | Attending: Emergency Medicine | Admitting: Emergency Medicine

## 2021-10-09 ENCOUNTER — Encounter (HOSPITAL_COMMUNITY): Payer: Self-pay

## 2021-10-09 DIAGNOSIS — J441 Chronic obstructive pulmonary disease with (acute) exacerbation: Secondary | ICD-10-CM | POA: Insufficient documentation

## 2021-10-09 DIAGNOSIS — J40 Bronchitis, not specified as acute or chronic: Secondary | ICD-10-CM

## 2021-10-09 DIAGNOSIS — Z7982 Long term (current) use of aspirin: Secondary | ICD-10-CM | POA: Insufficient documentation

## 2021-10-09 MED ORDER — IPRATROPIUM BROMIDE HFA 17 MCG/ACT IN AERS
2.0000 | INHALATION_SPRAY | Freq: Once | RESPIRATORY_TRACT | Status: AC
  Administered 2021-10-09: 2 via RESPIRATORY_TRACT
  Filled 2021-10-09: qty 12.9

## 2021-10-09 MED ORDER — PREDNISONE 20 MG PO TABS: 20.0000 mg | ORAL_TABLET | Freq: Every day | ORAL | 0 refills | Status: DC

## 2021-10-09 MED ORDER — PREDNISONE 20 MG PO TABS
60.0000 mg | ORAL_TABLET | Freq: Once | ORAL | Status: AC
  Administered 2021-10-09: 60 mg via ORAL
  Filled 2021-10-09: qty 3

## 2021-10-09 MED ORDER — AZITHROMYCIN 250 MG PO TABS: 250.0000 mg | ORAL_TABLET | Freq: Every day | ORAL | 0 refills | Status: DC

## 2021-10-09 MED ORDER — ALBUTEROL SULFATE HFA 108 (90 BASE) MCG/ACT IN AERS
4.0000 | INHALATION_SPRAY | Freq: Once | RESPIRATORY_TRACT | Status: AC
  Administered 2021-10-09: 4 via RESPIRATORY_TRACT
  Filled 2021-10-09: qty 6.7

## 2021-10-09 MED ORDER — ALBUTEROL SULFATE (2.5 MG/3ML) 0.083% IN NEBU: 2.5000 mg | INHALATION_SOLUTION | Freq: Four times a day (QID) | RESPIRATORY_TRACT | 12 refills | Status: AC | PRN

## 2021-10-09 NOTE — Discharge Instructions (Signed)
Take next dose of your steroids tomorrow.  Recommend using 4 puffs of albuterol inhaler every 4 hours as needed and 2 puffs of Atrovent inhaler every 6 hours as needed.  Take antibiotic as prescribed. ?

## 2021-10-09 NOTE — ED Triage Notes (Signed)
Pt arrived POV from home c/o a cough that she states has been going on for weeks now but it is starting to hurt her back and chest when she coughs now so she wants to be seen.  ?

## 2021-10-09 NOTE — ED Provider Notes (Signed)
?[facility] ?Provider Note ? ? ?CSN: [id number] ?Arrival date & time: 10/09/21  1028 ? ?  ? ?History ? ?Chief Complaint  ?Patient presents with  ? Cough  ? ? ?Angela Giles is a 66 y.o. female. ? ?The history is provided by the patient.  ?Cough ?Cough characteristics:  Non-productive ?Sputum characteristics:  Nondescript ?Severity:  Mild ?Onset quality:  Gradual ?Duration:  2 weeks ?Timing:  Constant ?Progression:  Unchanged ?Chronicity:  New ?Context: upper respiratory infection   ?Relieved by:  Nothing ?Worsened by:  Nothing ?Associated symptoms: no chest pain, no chills, no diaphoresis, no ear fullness, no ear pain, no eye discharge, no fever, no headaches, no myalgias, no rash, no rhinorrhea, no shortness of breath, no sinus congestion, no sore throat, no weight loss and no wheezing   ?Risk factors comment:  Copd ? ?  ? ?Home Medications ?Prior to Admission medications   ?Medication Sig Start Date End Date Taking? Authorizing Provider  ?albuterol (VENTOLIN HFA) 108 (90 Base) MCG/ACT inhaler 1-2 puffs every 4 (four) hours as needed for shortness of breath or wheezing. 05/08/21  Yes [provider]  ?APTIOM 400 MG TABS Take 1 tablet (400 mg total) by mouth daily. 06/16/21  Yes Van ClinesAquino, Karen M, MD  ?aspirin 81 MG EC tablet Take 81 mg by mouth daily. Swallow whole.   Yes [provider]  ?azithromycin (ZITHROMAX) 250 MG tablet Take 1 tablet (250 mg total) by mouth daily. Take first 2 tablets together, then 1 every day until finished. 10/09/21  Yes Keysha Damewood, DO  ?diphenhydrAMINE (BENADRYL) 25 MG tablet Take 25 mg by mouth at bedtime as needed.   Yes [provider]  ?hydrOXYzine (ATARAX) 25 MG tablet Take 1 tablet (25 mg total) by mouth at bedtime as needed. 09/28/21  Yes Sagardia, Eilleen KempfMiguel Jose, MD  ?lacosamide (VIMPAT) 200 MG TABS tablet Take 1 tablet (200 mg total) by mouth 2 (two) times daily. ?Patient taking differently: Take 200 mg by mouth  daily. 06/16/21  Yes Van ClinesAquino, Karen M, MD  ?montelukast (SINGULAIR) 5 MG chewable tablet Chew 10 mg by mouth at bedtime. 03/19/21  Yes [provider]  ?omeprazole (PRILOSEC) 20 MG capsule Take 20 mg by mouth daily.   Yes [provider]  ?predniSONE (DELTASONE) 20 MG tablet Take 1 tablet (20 mg total) by mouth daily for 4 days. 10/09/21 10/13/21 Yes Jaelin Fackler, DO  ?simvastatin (ZOCOR) 20 MG tablet Take 20 mg by mouth in the morning and at bedtime. 05/08/21  Yes [provider]  ?SYMBICORT 160-4.5 MCG/ACT inhaler Inhale 2 puffs into the lungs in the morning and at bedtime. 09/24/21  Yes [provider]  ?furosemide (LASIX) 20 MG tablet Take by mouth. ?Patient not taking: Reported on 10/09/2021 05/08/21   [provider]  ?guaiFENesin (ROBITUSSIN) 100 MG/5ML liquid Take 5 mLs by mouth every 4 (four) hours as needed for cough or to loosen phlegm. ?Patient not taking: Reported on 10/09/2021 06/15/21   Ilene Quaemaio, Alexa, MD  ?ibuprofen (ADVIL) 600 MG tablet Take 1 tablet (600 mg total) by mouth every 6 (six) hours as needed. ?Patient not taking: Reported on 09/28/2021 03/14/21   Tanda RockersGray, Samuel A, DO  ?ipratropium (ATROVENT HFA) 17 MCG/ACT inhaler Inhale 2 puffs into the lungs every 6 (six) hours as needed for wheezing. 03/14/21   Sloan LeiterGray, Samuel A, DO  ?loratadine (CLARITIN) 10 MG tablet Take 10 mg by mouth daily. ?Patient not taking: Reported on 09/28/2021 12/11/20   [provider]  ?Melatonin 5 MG CAPS Take 5 mg by mouth at bedtime as needed (sleep). ?Patient not taking: Reported on 10/09/2021    [provider]  ?methocarbamol (ROBAXIN) 500 MG tablet Take 1 tablet (500 mg total) by mouth 2 (two) times daily as needed for muscle spasms. ?Patient not taking: Reported on 06/14/2021 03/14/21   Sloan Leiter, DO  ?   ? ?Allergies    ?Beef-derived products, Fish allergy, and Milk-related compounds   ? ?Review of Systems   ?Review of Systems  ?Constitutional:  Negative for  chills, diaphoresis, fever and weight loss.  ?HENT:  Negative for ear pain, rhinorrhea and sore throat.   ?Eyes:  Negative for discharge.  ?Respiratory:  Positive for cough. Negative for shortness of breath and wheezing.   ?Cardiovascular:  Negative for chest pain.  ?Musculoskeletal:  Negative for myalgias.  ?Skin:  Negative for rash.  ?Neurological:  Negative for headaches.  ? ?Physical Exam ?Updated Vital Signs ?BP 132/74   Pulse 76   Temp 99.9 ?F (37.7 ?C) (Oral)   Resp (!) 22   Ht 5' (1.524 m)   Wt 63.5 kg   SpO2 97%   BMI 27.34 kg/m?  ?Physical Exam ?Vitals and nursing note reviewed.  ?Constitutional:   ?   General: She is not in acute distress. ?   Appearance: She is well-developed. She is not ill-appearing.  ?HENT:  ?   Head: Normocephalic and atraumatic.  ?   Nose: Nose normal.  ?   Mouth/Throat:  ?   Mouth: Mucous membranes are moist.  ?Eyes:  ?   Extraocular Movements: Extraocular movements intact.  ?   Conjunctiva/sclera: Conjunctivae normal.  ?   Pupils: Pupils are equal, round, and reactive to light.  ?Cardiovascular:  ?   Rate and Rhythm: Normal rate and regular rhythm.  ?   Pulses: Normal pulses.  ?   Heart sounds: Normal heart sounds. No murmur heard. ?Pulmonary:  ?   Effort: Pulmonary effort is normal. No respiratory distress.  ?   Breath sounds: Wheezing present.  ?Abdominal:  ?   Palpations: Abdomen is soft.  ?   Tenderness: There is no abdominal tenderness.  ?Musculoskeletal:     ?   General: No swelling.  ?   Cervical back: Normal range of motion and neck supple.  ?Skin: ?   General: Skin is warm and dry.  ?   Capillary Refill: Capillary refill takes less than 2 seconds.  ?Neurological:  ?   General: No focal deficit present.  ?   Mental Status: She is alert.  ?Psychiatric:     ?   Mood and Affect: Mood normal.  ? ? ?ED Results / Procedures / Treatments   ?Labs ?(all labs ordered are listed, but only abnormal results are displayed) ?Labs Reviewed - No data to display ? ?EKG ?EKG  Interpretation ? ?Date/Time:  Friday October 09 2021 14:29:29 EDT ?Ventricular Rate:  75 ?PR Interval:  156 ?QRS Duration: 80 ?QT Interval:  388 ?QTC Calculation: 433 ?R Axis:   79 ?Text Interpretation: Normal sinus rhythm Normal ECG When compared with ECG of 14-Jun-2021 15:57, No significant change since last tracing Confirmed by Alvira Monday ([id number]) on 10/09/2021 3:11:57 PM ? ?Radiology ?DG Chest 2 View ? ?Result Date: 10/09/2021 ?CLINICAL DATA:  66 year old female with chest pain. EXAM: CHEST - 2 VIEW COMPARISON:  06/14/2021 FINDINGS: The mediastinal contours are within normal limits. No cardiomegaly. The lungs are clear bilaterally without evidence of focal  consolidation, pleural effusion, or pneumothorax. No acute osseous abnormality. IMPRESSION: No acute cardiopulmonary process. Electronically Signed   By: Marliss Coots M.D.   On: 10/09/2021 13:57   ? ?Procedures ?Procedures  ? ? ?Medications Ordered in ED ?Medications  ?predniSONE (DELTASONE) tablet 60 mg (has no administration in time range)  ?albuterol (VENTOLIN HFA) 108 (90 Base) MCG/ACT inhaler 4 puff (has no administration in time range)  ?ipratropium (ATROVENT HFA) inhaler 2 puff (has no administration in time range)  ? ? ?ED Course/ Medical Decision Making/ A&P ?  ?                        ?Medical Decision Making ?Amount and/or Complexity of Data Reviewed ?Radiology: ordered. ? ?Risk ?Prescription drug management. ? ? ?Angela Giles is here with cough, COPD symptoms.  Overall unremarkable vitals.  No fever.  Symptoms for the last week or so.  Does not have inhalers currently.  Some mild wheezing on exam.  No respiratory distress.  No chest pain but does have some rib pain when she coughs really loud or hard.  She has had some sputum production.  Differential diagnosis is COPD exacerbation versus pneumonia versus viral process.  Chest x-ray ordered.  Will prescribe prednisone dose here and albuterol and Atrovent inhaler. ? ?Chest x-ray per my review  and interpretation shows no pneumonia.  EKG shows sinus rhythm.  No ischemic changes.  No concern for ACS or PE.  Overall suspect bronchitis/COPD exacerbation.  Will treat with steroids, Z-Pak.  She was provided inhalers.  Judi Cong

## 2021-10-10 ENCOUNTER — Inpatient Hospital Stay (HOSPITAL_COMMUNITY)
Admission: EM | Admit: 2021-10-10 | Discharge: 2021-10-12 | DRG: 871 | Disposition: A | Discharge status: Home / Self Care | Payer: Medicare (Managed Care) | Attending: Internal Medicine | Admitting: Internal Medicine

## 2021-10-10 ENCOUNTER — Encounter (HOSPITAL_COMMUNITY): Payer: Self-pay | Admitting: Internal Medicine

## 2021-10-10 ENCOUNTER — Other Ambulatory Visit: Payer: Self-pay

## 2021-10-10 ENCOUNTER — Emergency Department (HOSPITAL_COMMUNITY): Payer: Medicare (Managed Care)

## 2021-10-10 DIAGNOSIS — E876 Hypokalemia: Secondary | ICD-10-CM | POA: Diagnosis present

## 2021-10-10 DIAGNOSIS — K219 Gastro-esophageal reflux disease without esophagitis: Secondary | ICD-10-CM | POA: Diagnosis present

## 2021-10-10 DIAGNOSIS — J440 Chronic obstructive pulmonary disease with acute lower respiratory infection: Secondary | ICD-10-CM | POA: Diagnosis present

## 2021-10-10 DIAGNOSIS — I10 Essential (primary) hypertension: Secondary | ICD-10-CM | POA: Diagnosis present

## 2021-10-10 DIAGNOSIS — Z7982 Long term (current) use of aspirin: Secondary | ICD-10-CM

## 2021-10-10 DIAGNOSIS — E7800 Pure hypercholesterolemia, unspecified: Secondary | ICD-10-CM | POA: Diagnosis present

## 2021-10-10 DIAGNOSIS — Z7951 Long term (current) use of inhaled steroids: Secondary | ICD-10-CM | POA: Diagnosis not present

## 2021-10-10 DIAGNOSIS — Z91011 Allergy to milk products: Secondary | ICD-10-CM | POA: Diagnosis not present

## 2021-10-10 DIAGNOSIS — E871 Hypo-osmolality and hyponatremia: Secondary | ICD-10-CM | POA: Diagnosis present

## 2021-10-10 DIAGNOSIS — A419 Sepsis, unspecified organism: Secondary | ICD-10-CM | POA: Diagnosis present

## 2021-10-10 DIAGNOSIS — J441 Chronic obstructive pulmonary disease with (acute) exacerbation: Secondary | ICD-10-CM | POA: Diagnosis present

## 2021-10-10 DIAGNOSIS — J189 Pneumonia, unspecified organism: Secondary | ICD-10-CM | POA: Diagnosis present

## 2021-10-10 DIAGNOSIS — G40909 Epilepsy, unspecified, not intractable, without status epilepticus: Secondary | ICD-10-CM | POA: Diagnosis present

## 2021-10-10 DIAGNOSIS — J9601 Acute respiratory failure with hypoxia: Secondary | ICD-10-CM | POA: Diagnosis present

## 2021-10-10 DIAGNOSIS — Z91013 Allergy to seafood: Secondary | ICD-10-CM | POA: Diagnosis not present

## 2021-10-10 DIAGNOSIS — Z87891 Personal history of nicotine dependence: Secondary | ICD-10-CM

## 2021-10-10 DIAGNOSIS — Z79899 Other long term (current) drug therapy: Secondary | ICD-10-CM

## 2021-10-10 DIAGNOSIS — Z7952 Long term (current) use of systemic steroids: Secondary | ICD-10-CM | POA: Diagnosis not present

## 2021-10-10 DIAGNOSIS — Z8669 Personal history of other diseases of the nervous system and sense organs: Secondary | ICD-10-CM

## 2021-10-10 LAB — CBC WITH DIFFERENTIAL/PLATELET
Abs Immature Granulocytes: 0.11 10*3/uL — ABNORMAL HIGH (ref 0.00–0.07)
Basophils Absolute: 0 10*3/uL (ref 0.0–0.1)
Basophils Relative: 0 %
Eosinophils Absolute: 0 10*3/uL (ref 0.0–0.5)
Eosinophils Relative: 0 %
HCT: 34.4 % — ABNORMAL LOW (ref 36.0–46.0)
Hemoglobin: 12.2 g/dL (ref 12.0–15.0)
Immature Granulocytes: 1 %
Lymphocytes Relative: 3 %
Lymphs Abs: 0.7 10*3/uL (ref 0.7–4.0)
MCH: 29.6 pg (ref 26.0–34.0)
MCHC: 35.5 g/dL (ref 30.0–36.0)
MCV: 83.5 fL (ref 80.0–100.0)
Monocytes Absolute: 1.6 10*3/uL — ABNORMAL HIGH (ref 0.1–1.0)
Monocytes Relative: 8 %
Neutro Abs: 17.8 10*3/uL — ABNORMAL HIGH (ref 1.7–7.7)
Neutrophils Relative %: 88 %
Platelets: 270 10*3/uL (ref 150–400)
RBC: 4.12 MIL/uL (ref 3.87–5.11)
RDW: 12.2 % (ref 11.5–15.5)
WBC: 20.2 10*3/uL — ABNORMAL HIGH (ref 4.0–10.5)
nRBC: 0 % (ref 0.0–0.2)

## 2021-10-10 LAB — MAGNESIUM: Magnesium: 1.5 mg/dL — ABNORMAL LOW (ref 1.7–2.4)

## 2021-10-10 LAB — COMPREHENSIVE METABOLIC PANEL
ALT: 18 U/L (ref 0–44)
AST: 28 U/L (ref 15–41)
Albumin: 3.7 g/dL (ref 3.5–5.0)
Alkaline Phosphatase: 93 U/L (ref 38–126)
Anion gap: 12 (ref 5–15)
BUN: 18 mg/dL (ref 8–23)
CO2: 25 mmol/L (ref 22–32)
Calcium: 9 mg/dL (ref 8.9–10.3)
Chloride: 90 mmol/L — ABNORMAL LOW (ref 98–111)
Creatinine, Ser: 0.88 mg/dL (ref 0.44–1.00)
GFR, Estimated: >60 mL/min (ref >60)
Glucose, Bld: 122 mg/dL — ABNORMAL HIGH (ref 70–99)
Potassium: 2.6 mmol/L — CL (ref 3.5–5.1)
Sodium: 127 mmol/L — ABNORMAL LOW (ref 135–145)
Total Bilirubin: 0.5 mg/dL (ref 0.3–1.2)
Total Protein: 6.9 g/dL (ref 6.5–8.1)

## 2021-10-10 LAB — LIPASE, BLOOD: Lipase: 32 U/L (ref 11–51)

## 2021-10-10 LAB — PROTIME-INR
INR: 1 (ref 0.8–1.2)
Prothrombin Time: 13.1 seconds (ref 11.4–15.2)

## 2021-10-10 LAB — APTT: aPTT: 23 seconds — ABNORMAL LOW (ref 24–36)

## 2021-10-10 LAB — D-DIMER, QUANTITATIVE: D-Dimer, Quant: 0.87 ug/mL-FEU — ABNORMAL HIGH (ref 0.00–0.50)

## 2021-10-10 LAB — TSH: TSH: 0.952 u[IU]/mL (ref 0.350–4.500)

## 2021-10-10 LAB — TROPONIN I (HIGH SENSITIVITY)
Troponin I (High Sensitivity): 11 ng/L (ref <18)
Troponin I (High Sensitivity): 7 ng/L (ref <18)

## 2021-10-10 LAB — OSMOLALITY: Osmolality: 258 mOsm/kg — ABNORMAL LOW (ref 275–295)

## 2021-10-10 LAB — BRAIN NATRIURETIC PEPTIDE: B Natriuretic Peptide: 39.7 pg/mL (ref 0.0–100.0)

## 2021-10-10 LAB — PROCALCITONIN: Procalcitonin: 0.79 ng/mL

## 2021-10-10 LAB — LACTIC ACID, PLASMA: Lactic Acid, Venous: 1.3 mmol/L (ref 0.5–1.9)

## 2021-10-10 MED ORDER — PANTOPRAZOLE SODIUM 40 MG PO TBEC
40.0000 mg | DELAYED_RELEASE_TABLET | Freq: Every day | ORAL | Status: DC
Start: 2021-10-11 — End: 2021-10-12
  Administered 2021-10-11 – 2021-10-12 (×2): 40 mg via ORAL
  Filled 2021-10-10 (×2): qty 1

## 2021-10-10 MED ORDER — ACETAMINOPHEN 500 MG PO TABS
1000.0000 mg | ORAL_TABLET | Freq: Once | ORAL | Status: AC
  Administered 2021-10-10: 1000 mg via ORAL
  Filled 2021-10-10: qty 2

## 2021-10-10 MED ORDER — ESLICARBAZEPINE ACETATE 400 MG PO TABS
1.0000 | ORAL_TABLET | Freq: Every day | ORAL | Status: DC
  Filled 2021-10-10: qty 1

## 2021-10-10 MED ORDER — SODIUM CHLORIDE 0.9 % IV SOLN
1.0000 g | INTRAVENOUS | Status: DC
  Administered 2021-10-11: 1 g via INTRAVENOUS
  Filled 2021-10-10: qty 10

## 2021-10-10 MED ORDER — MONTELUKAST SODIUM 5 MG PO CHEW
10.0000 mg | CHEWABLE_TABLET | Freq: Every day | ORAL | Status: DC
  Administered 2021-10-10 – 2021-10-11 (×2): 10 mg via ORAL
  Filled 2021-10-10 (×3): qty 2

## 2021-10-10 MED ORDER — ALBUTEROL SULFATE (2.5 MG/3ML) 0.083% IN NEBU
2.5000 mg | INHALATION_SOLUTION | RESPIRATORY_TRACT | Status: DC | PRN
  Administered 2021-10-11 (×2): 2.5 mg via RESPIRATORY_TRACT
  Filled 2021-10-10 (×2): qty 3

## 2021-10-10 MED ORDER — BENZONATATE 100 MG PO CAPS
200.0000 mg | ORAL_CAPSULE | Freq: Three times a day (TID) | ORAL | Status: DC | PRN
  Administered 2021-10-10 – 2021-10-11 (×2): 200 mg via ORAL
  Filled 2021-10-10 (×2): qty 2

## 2021-10-10 MED ORDER — IPRATROPIUM-ALBUTEROL 0.5-2.5 (3) MG/3ML IN SOLN
3.0000 mL | Freq: Once | RESPIRATORY_TRACT | Status: AC
  Administered 2021-10-10: 3 mL via RESPIRATORY_TRACT
  Filled 2021-10-10: qty 3

## 2021-10-10 MED ORDER — ACETAMINOPHEN 650 MG RE SUPP: 650.0000 mg | Freq: Four times a day (QID) | RECTAL | Status: DC | PRN

## 2021-10-10 MED ORDER — ONDANSETRON HCL 4 MG/2ML IJ SOLN: 4.0000 mg | Freq: Four times a day (QID) | INTRAMUSCULAR | Status: DC | PRN

## 2021-10-10 MED ORDER — LACOSAMIDE 50 MG PO TABS
200.0000 mg | ORAL_TABLET | Freq: Every day | ORAL | Status: DC
  Administered 2021-10-11: 200 mg via ORAL
  Filled 2021-10-10: qty 4

## 2021-10-10 MED ORDER — POTASSIUM CHLORIDE CRYS ER 20 MEQ PO TBCR
40.0000 meq | EXTENDED_RELEASE_TABLET | Freq: Once | ORAL | Status: AC
  Administered 2021-10-10: 40 meq via ORAL
  Filled 2021-10-10: qty 2

## 2021-10-10 MED ORDER — PREDNISONE 20 MG PO TABS
40.0000 mg | ORAL_TABLET | Freq: Every day | ORAL | Status: DC
  Administered 2021-10-10 – 2021-10-12 (×2): 40 mg via ORAL
  Filled 2021-10-10 (×2): qty 2

## 2021-10-10 MED ORDER — HYDROXYZINE HCL 25 MG PO TABS
25.0000 mg | ORAL_TABLET | Freq: Every evening | ORAL | Status: DC | PRN
  Administered 2021-10-10 – 2021-10-11 (×2): 25 mg via ORAL
  Filled 2021-10-10 (×2): qty 1

## 2021-10-10 MED ORDER — ASPIRIN 81 MG PO CHEW
81.0000 mg | CHEWABLE_TABLET | Freq: Every day | ORAL | Status: DC
  Administered 2021-10-11 – 2021-10-12 (×2): 81 mg via ORAL
  Filled 2021-10-10 (×2): qty 1

## 2021-10-10 MED ORDER — ONDANSETRON HCL 4 MG/2ML IJ SOLN
4.0000 mg | Freq: Once | INTRAMUSCULAR | Status: AC
  Administered 2021-10-10: 4 mg via INTRAVENOUS
  Filled 2021-10-10: qty 2

## 2021-10-10 MED ORDER — ACETAMINOPHEN 325 MG PO TABS
650.0000 mg | ORAL_TABLET | Freq: Four times a day (QID) | ORAL | Status: DC | PRN
  Administered 2021-10-11: 650 mg via ORAL
  Filled 2021-10-10: qty 2

## 2021-10-10 MED ORDER — LACTATED RINGERS IV BOLUS
1000.0000 mL | Freq: Once | INTRAVENOUS | Status: AC
  Administered 2021-10-10: 1000 mL via INTRAVENOUS

## 2021-10-10 MED ORDER — IPRATROPIUM-ALBUTEROL 0.5-2.5 (3) MG/3ML IN SOLN
3.0000 mL | Freq: Four times a day (QID) | RESPIRATORY_TRACT | Status: DC
  Administered 2021-10-10 – 2021-10-11 (×3): 3 mL via RESPIRATORY_TRACT
  Filled 2021-10-10 (×3): qty 3

## 2021-10-10 MED ORDER — SODIUM CHLORIDE 0.9 % IV SOLN
500.0000 mg | Freq: Once | INTRAVENOUS | Status: AC
  Administered 2021-10-10: 500 mg via INTRAVENOUS
  Filled 2021-10-10: qty 5

## 2021-10-10 MED ORDER — SODIUM CHLORIDE 0.9 % IV SOLN
1.0000 g | Freq: Once | INTRAVENOUS | Status: AC
  Administered 2021-10-10: 1 g via INTRAVENOUS
  Filled 2021-10-10: qty 10

## 2021-10-10 MED ORDER — AZITHROMYCIN 500 MG IV SOLR
500.0000 mg | INTRAVENOUS | Status: DC
  Administered 2021-10-11: 500 mg via INTRAVENOUS
  Filled 2021-10-10 (×2): qty 5

## 2021-10-10 MED ORDER — POTASSIUM CHLORIDE 10 MEQ/100ML IV SOLN
10.0000 meq | Freq: Once | INTRAVENOUS | Status: AC
Start: 2021-10-10 — End: 2021-10-10
  Administered 2021-10-10: 10 meq via INTRAVENOUS
  Filled 2021-10-10: qty 100

## 2021-10-10 MED ORDER — MAGNESIUM SULFATE 2 GM/50ML IV SOLN
2.0000 g | Freq: Once | INTRAVENOUS | Status: AC
  Administered 2021-10-10: 2 g via INTRAVENOUS
  Filled 2021-10-10: qty 50

## 2021-10-10 MED ORDER — SIMVASTATIN 20 MG PO TABS
20.0000 mg | ORAL_TABLET | Freq: Every day | ORAL | Status: DC
  Administered 2021-10-10 – 2021-10-11 (×2): 20 mg via ORAL
  Filled 2021-10-10 (×2): qty 1

## 2021-10-10 NOTE — Assessment & Plan Note (Signed)
#)   Acute on chronic hypo-osmolar hyponatremia: In the setting of a history of chronic hyponatremia, with baseline serum sodium range of 1 29-1 30, with most recent prior serum sodium level of 129 in November 2022, presenting serum sodium level found to be slightly lower than this baseline, with initial value of 127, without need for hyperglycemic correction.  This acute exacerbation of chronic hyponatremia could be multifactorial in nature, with a suspected ?  ?element of hypovolumeia, from dehydration in the setting of clinical evidence of such as well as report of recent decline in oral intake as well as a few episodes of recent nausea/vomiting, as above. Differential also includes the possibility of a contribution from SIADH, particularly in the setting of presenting community-acquired pneumonia with resultant acute COPD exacerbation. ?  ?in general, the patient has received IV fluids in the ED this evening to attend to suspected contribution from dehydration, and will further evaluate for any additional contributing factors, including SIADH, as further detailed below.  ?  ?Potential chronic pharmacologic contributions in the setting of multiple antiepileptic medications as an outpatient. Of note, no evidence of associated acute focal neurologic deficits and no report of recent trauma.  ?  ?  ?  ?Plan: monitor strict I's and O's and daily weights.  check UA, random urine sodium, urine osmolality.  Check serum osmolality to confirm suspected hypoosmolar etiology.  Repeat BMP in the morning. Check TSH. Check serum uric acid level, as SIADH can be associated with hypouricemia due to hyperuricuria. refraining from additional IVF's pending these results, as above. ?  ?  ?  ?

## 2021-10-10 NOTE — ED Provider Notes (Signed)
?[facility] ?Provider Note ? ? ?CSN: [id number] ?Arrival date & time: 10/10/21  1642 ? ?  ? ?History ? ?Chief Complaint  ?Patient presents with  ? Emesis  ? ? ?Angela Giles is a 66 y.o. female. ? ?HPI ?66 year old female presents from [facility] via EMS after vomiting.  She states has been having coughing episodes for couple weeks but starting yesterday had chest and back pain with the coughing.  Was seen here and given antibiotic prescription as well as steroids.  She then picked up her prescriptions today but shortly thereafter developed vomiting.  She is still having a lot of coughing.  Was found to have a fever here and she suspect she is had a fever but did not have a thermometer.  She has been having chills.  Some abdominal discomfort that is now gone. ? ?Home Medications ?Prior to Admission medications   ?Medication Sig Start Date End Date Taking? Authorizing Provider  ?albuterol (PROVENTIL) (2.5 MG/3ML) 0.083% nebulizer solution Take 3 mLs (2.5 mg total) by nebulization every 6 (six) hours as needed for wheezing or shortness of breath. 10/09/21   Curatolo, Adam, DO  ?albuterol (VENTOLIN HFA) 108 (90 Base) MCG/ACT inhaler 1-2 puffs every 4 (four) hours as needed for shortness of breath or wheezing. 05/08/21   [provider]  ?APTIOM 400 MG TABS Take 1 tablet (400 mg total) by mouth daily. 06/16/21   Cameron Sprang, MD  ?aspirin 81 MG EC tablet Take 81 mg by mouth daily. Swallow whole.    [provider]  ?azithromycin (ZITHROMAX) 250 MG tablet Take 1 tablet (250 mg total) by mouth daily. Take first 2 tablets together, then 1 every day until finished. 10/09/21   Curatolo, Adam, DO  ?diphenhydrAMINE (BENADRYL) 25 MG tablet Take 25 mg by mouth at bedtime as needed.    [provider]  ?furosemide (LASIX) 20 MG tablet Take by mouth. ?Patient not taking: Reported on 10/09/2021 05/08/21   [provider]  ?guaiFENesin (ROBITUSSIN) 100 MG/5ML  liquid Take 5 mLs by mouth every 4 (four) hours as needed for cough or to loosen phlegm. ?Patient not taking: Reported on 10/09/2021 06/15/21   Scarlett Presto, MD  ?hydrOXYzine (ATARAX) 25 MG tablet Take 1 tablet (25 mg total) by mouth at bedtime as needed. 09/28/21   Horald Pollen, MD  ?ibuprofen (ADVIL) 600 MG tablet Take 1 tablet (600 mg total) by mouth every 6 (six) hours as needed. ?Patient not taking: Reported on 09/28/2021 03/14/21   Wynona Dove A, DO  ?ipratropium (ATROVENT HFA) 17 MCG/ACT inhaler Inhale 2 puffs into the lungs every 6 (six) hours as needed for wheezing. 03/14/21   Jeanell Sparrow, DO  ?lacosamide (VIMPAT) 200 MG TABS tablet Take 1 tablet (200 mg total) by mouth 2 (two) times daily. ?Patient taking differently: Take 200 mg by mouth daily. 06/16/21   Cameron Sprang, MD  ?loratadine (CLARITIN) 10 MG tablet Take 10 mg by mouth daily. ?Patient not taking: Reported on 09/28/2021 12/11/20   [provider]  ?Melatonin 5 MG CAPS Take 5 mg by mouth at bedtime as needed (sleep). ?Patient not taking: Reported on 10/09/2021    [provider]  ?methocarbamol (ROBAXIN) 500 MG tablet Take 1 tablet (500 mg total) by mouth 2 (two) times daily as needed for muscle spasms. ?Patient not taking: Reported on 06/14/2021 03/14/21   Wynona Dove A, DO  ?montelukast (SINGULAIR) 5 MG chewable tablet Chew 10 mg by mouth  at bedtime. 03/19/21   [provider]  ?omeprazole (PRILOSEC) 20 MG capsule Take 20 mg by mouth daily.    [provider]  ?predniSONE (DELTASONE) 20 MG tablet Take 1 tablet (20 mg total) by mouth daily for 4 days. 10/09/21 10/13/21  Lennice Sites, DO  ?simvastatin (ZOCOR) 20 MG tablet Take 20 mg by mouth in the morning and at bedtime. 05/08/21   [provider]  ?SYMBICORT 160-4.5 MCG/ACT inhaler Inhale 2 puffs into the lungs in the morning and at bedtime. 09/24/21   [provider]  ?   ? ?Allergies    ?Beef-derived products, Fish allergy, and  Milk-related compounds   ? ?Review of Systems   ?Review of Systems  ?Constitutional:  Positive for chills and fever.  ?Respiratory:  Positive for cough and shortness of breath.   ?Cardiovascular:  Positive for chest pain.  ?Gastrointestinal:  Positive for nausea and vomiting. Negative for abdominal pain.  ?Musculoskeletal:  Positive for back pain.  ? ?Physical Exam ?Updated Vital Signs ?BP (!) 141/64   Pulse 93   Temp (!) 102 ?F (38.9 ?C) (Oral)   Resp (!) 24   SpO2 95%  ?Physical Exam ?Vitals and nursing note reviewed.  ?Constitutional:   ?   Appearance: She is well-developed.  ?   Comments: Frequent coughing  ?HENT:  ?   Head: Normocephalic and atraumatic.  ?Cardiovascular:  ?   Rate and Rhythm: Normal rate and regular rhythm.  ?   Heart sounds: Normal heart sounds.  ?Pulmonary:  ?   Effort: Pulmonary effort is normal.  ?   Breath sounds: Examination of the right-lower field reveals decreased breath sounds and rales. Decreased breath sounds and rales present.  ?Abdominal:  ?   General: There is no distension.  ?   Palpations: Abdomen is soft.  ?   Tenderness: There is no abdominal tenderness.  ?Skin: ?   General: Skin is warm and dry.  ?Neurological:  ?   Mental Status: She is alert.  ? ? ?ED Results / Procedures / Treatments   ?Labs ?(all labs ordered are listed, but only abnormal results are displayed) ?Labs Reviewed  ?COMPREHENSIVE METABOLIC PANEL - Abnormal; Notable for the following components:  ?    Result Value  ? Sodium 127 (*)   ? Potassium 2.6 (*)   ? Chloride 90 (*)   ? Glucose, Bld 122 (*)   ? All other components within normal limits  ?CBC WITH DIFFERENTIAL/PLATELET - Abnormal; Notable for the following components:  ? WBC 20.2 (*)   ? HCT 34.4 (*)   ? Neutro Abs 17.8 (*)   ? Monocytes Absolute 1.6 (*)   ? Abs Immature Granulocytes 0.11 (*)   ? All other components within normal limits  ?APTT - Abnormal; Notable for the following components:  ? aPTT 23 (*)   ? All other components within normal  limits  ?MAGNESIUM - Abnormal; Notable for the following components:  ? Magnesium 1.5 (*)   ? All other components within normal limits  ?CULTURE, BLOOD (ROUTINE X 2)  ?CULTURE, BLOOD (ROUTINE X 2)  ?URINE CULTURE  ?RESP PANEL BY RT-PCR (FLU A&B, COVID) ARPGX2  ?LACTIC ACID, PLASMA  ?PROTIME-INR  ?LIPASE, BLOOD  ?MAGNESIUM  ?PHOSPHORUS  ?COMPREHENSIVE METABOLIC PANEL  ?CBC WITH DIFFERENTIAL/PLATELET  ?BLOOD GAS, VENOUS  ?TROPONIN I (HIGH SENSITIVITY)  ? ? ?EKG ?EKG Interpretation ? ?Date/Time:  Saturday October 10 2021 18:43:26 EDT ?Ventricular Rate:  89 ?PR Interval:  166 ?QRS Duration:  85 ?QT Interval:  345 ?QTC Calculation: 420 ?R Axis:   81 ?Text Interpretation: Sinus rhythm Borderline right axis deviation Probable anteroseptal infarct, old Borderline ST depression, diffuse leads ST depressions more prominent when compared? to yesterday Confirmed by Sherwood Gambler [phone]) on 10/10/2021 7:40:30 PM ? ?Radiology ?DG Chest 2 View ? ?Result Date: 10/10/2021 ?CLINICAL DATA:  cough, fever, vomiting EXAM: CHEST - 2 VIEW COMPARISON:  Radiograph 10/09/2021 FINDINGS: Unchanged cardiomediastinal silhouette. There are mild diffuse interstitial opacities. There is increased airspace disease in the right middle lobe. No pleural effusion. No pneumothorax. No acute osseous abnormality. IMPRESSION: Right middle lobe pneumonia. Mild diffuse interstitial opacities, possibly superimposed mild interstitial edema. Electronically Signed   By: Maurine Simmering M.D.   On: 10/10/2021 18:51  ? ?DG Chest 2 View ? ?Result Date: 10/09/2021 ?CLINICAL DATA:  66 year old female with chest pain. EXAM: CHEST - 2 VIEW COMPARISON:  06/14/2021 FINDINGS: The mediastinal contours are within normal limits. No cardiomegaly. The lungs are clear bilaterally without evidence of focal consolidation, pleural effusion, or pneumothorax. No acute osseous abnormality. IMPRESSION: No acute cardiopulmonary process. Electronically Signed   By: Ruthann Cancer M.D.   On:  10/09/2021 13:57   ? ?Procedures ?Marland KitchenCritical Care ?Performed by: Sherwood Gambler, MD ?Authorized by: Sherwood Gambler, MD  ? ?Critical care provider statement:  ?  Critical care time (minutes):  30 ?  Critical care time was

## 2021-10-10 NOTE — Assessment & Plan Note (Signed)
#)   GERD: documented h/o such; on omeprazole as outpatient.  ?  ?Plan: continue home PPI.  ?  ?

## 2021-10-10 NOTE — ED Triage Notes (Signed)
Pt here via EMS from [facility] d/t N/V. Multiple episodes of emesis. Initially, per EMS pt was Alert to self only.  Was picking up antibiotics when episode occurred.  ?EMs gave 4mg  Zofran ? ?140/54 ?HR 92 ?RR 32 ?94% RA ?148 CBG ?Temp 99.5 ? ?20g LAC ?

## 2021-10-10 NOTE — Assessment & Plan Note (Signed)
#)  Sepsis due to community-acquired pneumonia: Diagnosis on the basis of 10 days of progressive ?New onset cough, shortness of breath, objective fever, with today's chest x-ray showing interval development of right middle lobe airspace opacity consistent with pneumonia.  There is also evidence of mild interstitial opacities bilaterally without clinical evidence of congestive heart failure, potentially representing SIRS criteria concerning for sepsis and atypical component.   ?  ?SIRS criteria met via.  The objective fever, tachycardia, leukocytosis.. Of note, in the absence of any evidence of end organ damage, including a non-elevated presenting LA of arterial 1.3 tachypnea, tachycardia, leukocytosis, pt's sepsis does not meet criteria to be considered severe in nature. Also, in the absence of LA level greater than or equal to 4.0, and in the absence of any associated hypotension refractory to IVF's, there are no indications for administration of a 30 mL/kg IVF bolus at this time.  ?  ?Additional ED work-up/management notable for: Blood cultures x2 collected prior to initiation of Rocephin and azithromycin, which will be continued for community-acquired coverage, also providing coverage for atypicals, as further discussed above.  Of note, COVID-19/influenza PCR results are currently pending. Will also check urinalysis. ?  ?  ?Plan: CBC w/ diff and CMP in AM. Follow for results of blood cx's x 2. Abx: Continue Rocephin and azithromycin, as above.  Check sputum culture.  Left add on procalcitonin level.  Check strep pneumoniae urine antigen, mycoplasma antibodies, Legionella urine antigen.  Flutter valve, NSAIDs from a tree.  Prn Tessalon Perles.  Check urinalysis.  Prn acetaminophen for fever. ?  ?

## 2021-10-10 NOTE — H&P (Addendum)
?History and Physical  ? ? ?PLEASE NOTE THAT [employer]AGON DICTATION SOFTWARE WAS USED IN THE CONSTRUCTION OF THIS NOTE. ? ? ?Angela Giles [id number] DOB: 09-12-1955 DOA: 10/10/2021 ? ?PCP: Georgina Quint, MD  ?Patient coming from: home  ? ?I have personally briefly reviewed patient's old medical records in [facility] ? ?Chief Complaint: Cough ? ?HPI: Angela Giles is a 66 y.o. female with medical history significant for COPD, hyperlipidemia, seizure disorder, chronic hyponatremia with baseline serum sodium 129 - 130, who is admitted to [facility] on 10/10/2021 with sepsis due to community-acquired pneumonia after presenting from home to [facility] ED complaining of cough.  ? ?The patient reports 10 days progressive productive cough in the absence of any hemoptysis.  More recently, over the last 4 to 5 days, she notes associated progressive shortness of breath in the absence of any orthopnea, PND, or new onset peripheral edema.  She also notes associated subjective fever with temperature max of 102 at home over that timeframe.  Otherwise denies chills, full body rigors, or generalized myalgias.  Not associate with any rash, headache, neck stiffness, abdominal pain, diarrhea.  She has noted some intermittent nausea resulting in the 2 episodes of nonbloody, nonbilious emesis over the last few days, with most recent such episode occurring earlier today.  As a consequence of this, she conveys recent relative decline in oral intake.  No recent dysuria or gross hematuria. ? ?Over the last 2 to 3 days, she has noted new onset anterior chest wall discomfort, limited to coughing or periods of deep inspiration.  Denies any new onset calf tenderness or new onset lower extremity edema.  No history of DVT or PE.  No recent trauma or travel.  No history of recent surgical procedures or prolonged diminished ambulatory status.  No known history of underlying malignancy.  This discomfort is nonexertional and  nonpositional. ? ?She conveys a history of COPD in the setting of being a former smoker.  No known baseline supplemental oxygen requirements.  Denies any known history of underlying heart failure, no prior echocardiogram available per initial chart review. ? ?She had presented to [facility] emergency department yesterday, 10/09/2021, with the above complaints, at which time chest x-ray showed no evidence of acute cardiopulmonary process.  There was concern for potential mild acute COPD exacerbation, and at that time received prednisone, albuterol, azithromycin, and was subsequent discharged home from the ED.  ? ?However, in the setting of further progression of the above symptoms in the interval, she presents back to [facility], ED this evening for further evaluation management of the above. ? ? ? ?ED Course:  ?Vital signs in the ED were notable for the following: Temperature max 102; heart rate 86-97; blood pressure 119/66 - 141/64; respiratory rate 21-27, oxygen saturation 95 to 100% on room air. ? ?Labs were notable for the following: CMP notable for the following: Sodium 127 compared to most recent prior value of 129 on 06/07/2021, potassium 2.6, chloride 90, bicarbonate 25 creatinine 0.88 liver enzymes within normal limits, lipase 32.  Serum magnesium level 1.5.  CBC notable for white cell count 20,200 with 88% neutrophils.  Lactic acid 1.3 high-sensitivity troponin high result currently pending.  COVID-19/influenza PCR results pending.  Blood cultures x2 collected prior to initiation of IV antibiotics. ? ?Imaging and additional notable ED work-up: EKG shows sinus rhythm with heart rate 89, normal intervals, no evidence of T wave changes, nonspecific +1 mm ST depression in inferior leads plus  V6, similar to that seen on most recent prior EKG from 10/09/2021, although slightly more pronounced in the interval, while demonstrating no evidence of ST elevation. ? ?Chest x-ray shows interval development of right middle  lobe airspace opacity consistent with pneumonia, also showing mild interstitial opacities bilaterally without evidence of overt pulmonary edema, nor any evidence of pleural effusion or pneumothorax. ? ?While in the ED, the following were administered: Acetaminophen 1 g p.o. x1, duo nebulizer treatment x1, Zofran 4 mg IV x1, potassium chloride 40 mEq p.o. x1, potassium chloride 10 mill equivalents IV over 1 hour x 1 dose, azithromycin, Rocephin, 1 L LR bolus. ? ?Subsequently, the patient was admitted the patient was admitted for further evaluation and management of sepsis due to suspected community-acquired pneumonia, with resultant acute COPD exacerbation with presenting laboratory results also notable for hypokalemia as well as hypomagnesemia. ? ? ? ? ?Review of Systems: As per HPI otherwise 10 point review of systems negative.  ? ?Past Medical History:  ?Diagnosis Date  ? Chronic back pain   ? COPD (chronic obstructive pulmonary disease) (HCC)   ? Dorsalgia   ? High cholesterol   ? Hypertension   ? Seizures (HCC)   ? ? ?Past Surgical History:  ?Procedure Laterality Date  ? ABDOMINAL HYSTERECTOMY    ? CHOLECYSTECTOMY    ? ? ?Social History: ? reports that she has quit smoking. She has never used smokeless tobacco. She reports that she does not drink alcohol and does not use drugs. ? ? ?Allergies  ?Allergen Reactions  ? Beef-Derived Products   ? Fish Allergy   ? Milk-Related Compounds   ? ? ?Family History  ?Problem Relation Age of Onset  ? Dementia Mother   ? ? ?Family history reviewed and not pertinent  ? ? ?Prior to Admission medications   ?Medication Sig Start Date End Date Taking? Authorizing Provider  ?albuterol (PROVENTIL) (2.5 MG/3ML) 0.083% nebulizer solution Take 3 mLs (2.5 mg total) by nebulization every 6 (six) hours as needed for wheezing or shortness of breath. 10/09/21   Curatolo, Adam, DO  ?albuterol (VENTOLIN HFA) 108 (90 Base) MCG/ACT inhaler 1-2 puffs every 4 (four) hours as needed for shortness of  breath or wheezing. 05/08/21   [provider]  ?APTIOM 400 MG TABS Take 1 tablet (400 mg total) by mouth daily. 06/16/21   Van ClinesAquino, Karen M, MD  ?aspirin 81 MG EC tablet Take 81 mg by mouth daily. Swallow whole.    [provider]  ?azithromycin (ZITHROMAX) 250 MG tablet Take 1 tablet (250 mg total) by mouth daily. Take first 2 tablets together, then 1 every day until finished. 10/09/21   Curatolo, Adam, DO  ?diphenhydrAMINE (BENADRYL) 25 MG tablet Take 25 mg by mouth at bedtime as needed.    [provider]  ?furosemide (LASIX) 20 MG tablet Take by mouth. ?Patient not taking: Reported on 10/09/2021 05/08/21   [provider]  ?guaiFENesin (ROBITUSSIN) 100 MG/5ML liquid Take 5 mLs by mouth every 4 (four) hours as needed for cough or to loosen phlegm. ?Patient not taking: Reported on 10/09/2021 06/15/21   Ilene Quaemaio, Alexa, MD  ?hydrOXYzine (ATARAX) 25 MG tablet Take 1 tablet (25 mg total) by mouth at bedtime as needed. 09/28/21   Georgina QuintSagardia, Miguel Jose, MD  ?ibuprofen (ADVIL) 600 MG tablet Take 1 tablet (600 mg total) by mouth every 6 (six) hours as needed. ?Patient not taking: Reported on 09/28/2021 03/14/21   Tanda RockersGray, Samuel A, DO  ?ipratropium (ATROVENT HFA) 17 MCG/ACT  inhaler Inhale 2 puffs into the lungs every 6 (six) hours as needed for wheezing. 03/14/21   Tanda Rockers A, DO  ?lacosamide (VIMPAT) 200 MG TABS tablet Take 1 tablet (200 mg total) by mouth 2 (two) times daily. ?Patient taking differently: Take 200 mg by mouth daily. 06/16/21   Van Clines, MD  ?loratadine (CLARITIN) 10 MG tablet Take 10 mg by mouth daily. ?Patient not taking: Reported on 09/28/2021 12/11/20   [provider]  ?Melatonin 5 MG CAPS Take 5 mg by mouth at bedtime as needed (sleep). ?Patient not taking: Reported on 10/09/2021    [provider]  ?methocarbamol (ROBAXIN) 500 MG tablet Take 1 tablet (500 mg total) by mouth 2 (two) times daily as needed for muscle spasms. ?Patient not taking:  Reported on 06/14/2021 03/14/21   Tanda Rockers A, DO  ?montelukast (SINGULAIR) 5 MG chewable tablet Chew 10 mg by mouth at bedtime. 03/19/21   [provider]  ?omeprazole (PRILOSEC) 20 MG capsule Take 20 mg b

## 2021-10-10 NOTE — Assessment & Plan Note (Signed)
?  ?#)   Seizure disorder: Documented history of such, on Vimpat and Aptiom as an outpatient.  No evidence of acute seizures at this time. ?  ?Plan: Continue outpatient antiepileptic regimen.  Supplementation of hypomagnesemia, as above.  Further evaluation management of acute on chronic hyponatremia, including repeat BMP in the morning.  Monitor strict I's and O's. ?  ?  ?

## 2021-10-10 NOTE — Assessment & Plan Note (Signed)
?#)   Hypokalemia: Presenting potassium level 2.6, with suspected contribution.  Recent decline in oral intake as well as increase in GI losses in the form of recent nausea/vomiting, as further detailed above.  Further exacerbating this finding is the concomitant finding of hypomagnesemia, as further detailed below.   ?  ?Plan: Currently receiving potassium chloride 40 mEq p.o. x1 dose as well as 10 mill equivalents of IV potassium chloride.  Additionally, she has received 1 L LR bolus in the ED.  In further evaluation management of hypomagnesemia, as further detailed below.  Monitor on symmetry.  Repeat BMP as well as serum magnesium level in the morning. ?  ?

## 2021-10-10 NOTE — Assessment & Plan Note (Signed)
 #)   Hyperlipidemia: documented h/o such. On simvastatin as outpatient.    Plan: continue home statin.    

## 2021-10-10 NOTE — Assessment & Plan Note (Signed)
?  ?#)   Acute COPD exacerbation: Diagnosis suspected the basis of.  1 week of progressive shortness of breath, associated with new onset cough, mildly increased work of breathing, all in the context of documented history of underlying COPD in the setting of being a former smoker.  Suspect that this is stemming from presenting sepsis due to community-acquired pneumonia, as above.  COVID-19/influenza PCR results currently pending.  In the absence of any recent typical chest discomfort, ACS is felt to be less likely, although high-sensitivity troponin I result currently pending.  Nonspecific results on presenting EKG, without any evidence of STEMI, as further detailed above.  Clinically, acutely decompensated heart failure appears less likely, although there was some evidence of mild bilateral interstitial opacities on presenting chest x-ray, but without evidence of overt pulmonary edema.  will add on BNP to further evaluate.  Additionally, in the setting of an element of pleuritic chest discomfort, will also add on D-dimer, while acknowledging that patient's pleuritic chest discomfort it We will be from her community-acquired pneumonia proper as well as musculoskeletal consequences of greater than 1 week of new onset cough.  Of note, patient has experienced progression of the above symptoms in spite of steroids, nebulizer treatment administered in the emergency department yesterday. ?  ?  ?Plan: monitor continuous pulse oxymetry. Monitor on telemetry.  Prednisone. Scheduled duonebs q6 hours. Prn albuterol inhaler. BMP in the morning. Repeat CBC in the morning.  IV magnesium sulfate supplementation, as above, with repeat serum magnesium level in the morning.  Check serum Phos levels. Will attempt additional chart review to evaluate most recent PFT results.  Further evaluation management presenting sepsis due to community-acquired pneumonia neck, including IV antibiotics to include Rocephin and azithromycin.  Check  blood gas. Add-on procalcitonin.  Follow-up result of troponin.  Check D-dimer.  Follow-up result of COVID-19/influenza PCR.  Monitor strict I's and O's and daily weights.  Add on BNP. ?  ?  ?

## 2021-10-10 NOTE — Assessment & Plan Note (Signed)
?  ?  ?#)   Hypomagnesemia: Presenting magnesium level found to be 1.5. ?  ?Plan: Magnesium sulfate 2 g IV return assessment is now.  Repeat magnesium level in the morning.  Monitor on telemetry. ?  ?  ?  ?

## 2021-10-11 LAB — CBC WITH DIFFERENTIAL/PLATELET
Abs Immature Granulocytes: 0.16 10*3/uL — ABNORMAL HIGH (ref 0.00–0.07)
Basophils Absolute: 0 10*3/uL (ref 0.0–0.1)
Basophils Relative: 0 %
Eosinophils Absolute: 0 10*3/uL (ref 0.0–0.5)
Eosinophils Relative: 0 %
HCT: 29.9 % — ABNORMAL LOW (ref 36.0–46.0)
Hemoglobin: 10.5 g/dL — ABNORMAL LOW (ref 12.0–15.0)
Immature Granulocytes: 1 %
Lymphocytes Relative: 3 %
Lymphs Abs: 0.7 10*3/uL (ref 0.7–4.0)
MCH: 29.6 pg (ref 26.0–34.0)
MCHC: 35.1 g/dL (ref 30.0–36.0)
MCV: 84.2 fL (ref 80.0–100.0)
Monocytes Absolute: 1.6 10*3/uL — ABNORMAL HIGH (ref 0.1–1.0)
Monocytes Relative: 8 %
Neutro Abs: 18.5 10*3/uL — ABNORMAL HIGH (ref 1.7–7.7)
Neutrophils Relative %: 88 %
Platelets: 265 10*3/uL (ref 150–400)
RBC: 3.55 MIL/uL — ABNORMAL LOW (ref 3.87–5.11)
RDW: 12.2 % (ref 11.5–15.5)
WBC: 21 10*3/uL — ABNORMAL HIGH (ref 4.0–10.5)
nRBC: 0 % (ref 0.0–0.2)

## 2021-10-11 LAB — URINALYSIS, COMPLETE (UACMP) WITH MICROSCOPIC
Bacteria, UA: NONE SEEN
Bilirubin Urine: NEGATIVE
Glucose, UA: NEGATIVE mg/dL
Ketones, ur: NEGATIVE mg/dL
Leukocytes,Ua: NEGATIVE
Nitrite: NEGATIVE
Protein, ur: 30 mg/dL — AB
Specific Gravity, Urine: 1.018 (ref 1.005–1.030)
pH: 6 (ref 5.0–8.0)

## 2021-10-11 LAB — COMPREHENSIVE METABOLIC PANEL
ALT: 15 U/L (ref 0–44)
AST: 31 U/L (ref 15–41)
Albumin: 3.4 g/dL — ABNORMAL LOW (ref 3.5–5.0)
Alkaline Phosphatase: 92 U/L (ref 38–126)
Anion gap: 11 (ref 5–15)
BUN: 15 mg/dL (ref 8–23)
CO2: 23 mmol/L (ref 22–32)
Calcium: 8.6 mg/dL — ABNORMAL LOW (ref 8.9–10.3)
Chloride: 93 mmol/L — ABNORMAL LOW (ref 98–111)
Creatinine, Ser: 0.78 mg/dL (ref 0.44–1.00)
GFR, Estimated: >60 mL/min (ref >60)
Glucose, Bld: 108 mg/dL — ABNORMAL HIGH (ref 70–99)
Potassium: 3.6 mmol/L (ref 3.5–5.1)
Sodium: 127 mmol/L — ABNORMAL LOW (ref 135–145)
Total Bilirubin: 0.5 mg/dL (ref 0.3–1.2)
Total Protein: 6.3 g/dL — ABNORMAL LOW (ref 6.5–8.1)

## 2021-10-11 LAB — SODIUM, URINE, RANDOM: Sodium, Ur: 68 mmol/L

## 2021-10-11 LAB — BLOOD GAS, VENOUS
Acid-Base Excess: 3 mmol/L — ABNORMAL HIGH (ref 0.0–2.0)
Bicarbonate: 27.9 mmol/L (ref 20.0–28.0)
O2 Saturation: 50.2 %
Patient temperature: 37
pCO2, Ven: 43 mmHg — ABNORMAL LOW (ref 44–60)
pH, Ven: 7.42 (ref 7.25–7.43)
pO2, Ven: <31 mmHg — CL (ref 32–45)

## 2021-10-11 LAB — OSMOLALITY, URINE: Osmolality, Ur: 589 mOsm/kg (ref 300–900)

## 2021-10-11 LAB — STREP PNEUMONIAE URINARY ANTIGEN: Strep Pneumo Urinary Antigen: NEGATIVE

## 2021-10-11 LAB — MAGNESIUM: Magnesium: 2.5 mg/dL — ABNORMAL HIGH (ref 1.7–2.4)

## 2021-10-11 LAB — PHOSPHORUS: Phosphorus: 3.5 mg/dL (ref 2.5–4.6)

## 2021-10-11 LAB — URIC ACID: Uric Acid, Serum: 3.4 mg/dL (ref 2.5–7.1)

## 2021-10-11 LAB — CREATININE, URINE, RANDOM: Creatinine, Urine: 110.67 mg/dL

## 2021-10-11 MED ORDER — ALBUTEROL SULFATE HFA 108 (90 BASE) MCG/ACT IN AERS: 2.0000 | INHALATION_SPRAY | RESPIRATORY_TRACT | Status: DC | PRN

## 2021-10-11 MED ORDER — LACOSAMIDE 200 MG PO TABS
200.0000 mg | ORAL_TABLET | Freq: Two times a day (BID) | ORAL | Status: DC
  Administered 2021-10-12 (×2): 200 mg via ORAL
  Filled 2021-10-11 (×2): qty 1

## 2021-10-11 MED ORDER — ESLICARBAZEPINE ACETATE 400 MG PO TABS
1.0000 | ORAL_TABLET | Freq: Every day | ORAL | Status: DC
  Administered 2021-10-11: 400 mg via ORAL

## 2021-10-11 MED ORDER — GUAIFENESIN ER 600 MG PO TB12
600.0000 mg | ORAL_TABLET | Freq: Two times a day (BID) | ORAL | Status: DC
  Administered 2021-10-11 – 2021-10-12 (×3): 600 mg via ORAL
  Filled 2021-10-11 (×3): qty 1

## 2021-10-11 MED ORDER — ESLICARBAZEPINE ACETATE 400 MG PO TABS: 1.0000 | ORAL_TABLET | Freq: Every day | ORAL | Status: DC

## 2021-10-11 MED ORDER — SODIUM CHLORIDE 1 G PO TABS
2.0000 g | ORAL_TABLET | Freq: Two times a day (BID) | ORAL | Status: DC
  Administered 2021-10-11 – 2021-10-12 (×2): 2 g via ORAL
  Filled 2021-10-11 (×3): qty 2

## 2021-10-11 NOTE — Progress Notes (Addendum)
?PROGRESS NOTE ? ?Angela Giles  GEX:[id number] DOB: [birth year] DOA: 10/10/2021 ?PCP: Georgina Quint, MD  ? ?Brief Narrative: ?Patient is a 66 year old female with history of COPD, hyperlipidemia, seizure disorder, chronic hyponatremia with baseline sodium of 130 who presented to the emergency department with complaints of cough, shortness of breath, peripheral edema, subjective fever.  On presentation, patient was febrile but saturating fine on room air.  Lab work showed sodium of 127, potassium was 2.6, WBC of 20.2, magnesium of 1.5.  COVID/influenza PCR negative.  Chest x-ray on presentation did not show any acute cardiopulmonary process, but repeat chest x-ray showed right middle lobe pneumonia..  Patient was admitted for the management of suspected pneumonia/COPD exacerbation. ? ?Assessment & Plan: ? ?Principal Problem: ?  CAP (community acquired pneumonia) ?Active Problems: ?  History of seizure disorder ?  Sepsis (HCC) ?  Hypokalemia ?  Hypomagnesemia ?  COPD with acute exacerbation (HCC) ?  Hyponatremia ?  High cholesterol ?  GERD (gastroesophageal reflux disease) ? ? ?Sepsis due to community-acquired pneumonia: Presented with cough, shortness of breath, fever.  Tachycardic on presentation.  Lab work showed leukocytosis.  Initial chest x-ray did not show any acute cardiopulmonary process but repeat chest x-ray showed medial lobe pneumonia on the right, mild interstitial opacities.  Started on ceftriaxone, azithromycin.  Blood cultures collected.  Follow-up Legionella, streptococcal pneumonia antigen, mycoplasma pneumonia. ?Continues to be bothered with cough.  Continue current medications.  Afebrile this morning ? ?COPD exacerbation: Likely secondary to pneumonia.  Patient is a past smoker.  Continue bronchodilators, continue prednisone ? ?Acute hypoxic respiratory failure: Secondary to pneumonia/COPD exacerbation. currently on 2 L of oxygen per minute.  Not on oxygen at home.  We will try to wean the  oxygen. ? ?Severe hypokalemia/hypomagnesemia: Being supplemented and monitored ? ?Acute on chronic hyponatremia: Baseline sodium of 130.  Presented with sodium of 127.  Suspected SIADH.Started on salt tabs,fluid restriction ? ?Hyperlipidemia: Simvastatin ? ?Seizure disorder: Takes Vimpat, Aptiom as an outpatient ? ?GERD: Continue PPI ? ? ?  ?  ? ?DVT prophylaxis:SCDs Start: 10/10/21 2002 ? ? ?  Code Status: Full Code ? ?Family Communication: None at bedside ? ?Patient status:Inpatient ? ?Patient is from :Home ? ?Anticipated discharge WN:UUVO ? ?Estimated DC date:In 1-2 days ? ? ?Consultants: None ? ?Procedures:None ? ?Antimicrobials:  ?Anti-infectives (From admission, onward)  ? ? Start     Dose/Rate Route Frequency Ordered Stop  ? 10/11/21 1700  azithromycin (ZITHROMAX) 500 mg in sodium chloride 0.9 % 250 mL IVPB       ? 500 mg ?250 mL/hr over 60 Minutes Intravenous Every 24 hours 10/10/21 2003    ? 10/11/21 1700  cefTRIAXone (ROCEPHIN) 1 g in sodium chloride 0.9 % 100 mL IVPB       ? 1 g ?200 mL/hr over 30 Minutes Intravenous Every 24 hours 10/10/21 2003    ? 10/10/21 1845  cefTRIAXone (ROCEPHIN) 1 g in sodium chloride 0.9 % 100 mL IVPB       ? 1 g ?200 mL/hr over 30 Minutes Intravenous  Once 10/10/21 1844 10/10/21 2043  ? 10/10/21 1845  azithromycin (ZITHROMAX) 500 mg in sodium chloride 0.9 % 250 mL IVPB       ? 500 mg ?250 mL/hr over 60 Minutes Intravenous  Once 10/10/21 1844 10/10/21 2105  ? ?  ? ? ?Subjective: ? ?Patient seen and examined at the bedside this morning.  Hemodynamically stable.  She is feeling better but is bothered with cough.  Continuously coughing during my evaluation. ? ?Objective: ?Vitals:  ? 10/10/21 2100 10/10/21 2128 10/11/21 0152 10/11/21 0500  ?BP: (!) 118/58 (!) 126/55  110/60  ?Pulse: 87 87  66  ?Resp: (!) 23   (!) 22  ?Temp:  99.1 ?F (37.3 ?C)  98.7 ?F (37.1 ?C)  ?TempSrc:  Oral  Oral  ?SpO2: 94% 97% 95% 100%  ? ?No intake or output data in the 24 hours ending 10/11/21 0741 ?There  were no vitals filed for this visit. ? ?Examination: ? ?General exam: Overall comfortable, not in distress ?HEENT: PERRL ?Respiratory system:  no wheezes or crackles  ?Cardiovascular system: S1 & S2 heard, RRR.  ?Gastrointestinal system: Abdomen is nondistended, soft and nontender. ?Central nervous system: Alert and oriented ?Extremities: No edema, no clubbing ,no cyanosis ?Skin: No rashes, no ulcers,no icterus   ? ? ?Data Reviewed: I have personally reviewed following labs and imaging studies ? ?CBC: ?Recent Labs  ?Lab 10/10/21 ?1750 10/11/21 ?0041  ?WBC 20.2* 21.0*  ?NEUTROABS 17.8* 18.5*  ?HGB 12.2 10.5*  ?HCT 34.4* 29.9*  ?MCV 83.5 84.2  ?PLT 270 265  ? ?Basic Metabolic Panel: ?Recent Labs  ?Lab 10/10/21 ?1750 10/10/21 ?1928 10/11/21 ?0041  ?NA 127*  --  127*  ?K 2.6*  --  3.6  ?CL 90*  --  93*  ?CO2 25  --  23  ?GLUCOSE 122*  --  108*  ?BUN 18  --  15  ?CREATININE 0.88  --  0.78  ?CALCIUM 9.0  --  8.6*  ?MG  --  1.5* 2.5*  ?PHOS  --   --  3.5  ? ? ? ?No results found for this or any previous visit (from the past 240 hour(s)).  ? ?Radiology Studies: ?DG Chest 2 View ? ?Result Date: 10/10/2021 ?CLINICAL DATA:  cough, fever, vomiting EXAM: CHEST - 2 VIEW COMPARISON:  Radiograph 10/09/2021 FINDINGS: Unchanged cardiomediastinal silhouette. There are mild diffuse interstitial opacities. There is increased airspace disease in the right middle lobe. No pleural effusion. No pneumothorax. No acute osseous abnormality. IMPRESSION: Right middle lobe pneumonia. Mild diffuse interstitial opacities, possibly superimposed mild interstitial edema. Electronically Signed   By: Caprice Renshaw M.D.   On: 10/10/2021 18:51  ? ?DG Chest 2 View ? ?Result Date: 10/09/2021 ?CLINICAL DATA:  66 year old female with chest pain. EXAM: CHEST - 2 VIEW COMPARISON:  06/14/2021 FINDINGS: The mediastinal contours are within normal limits. No cardiomegaly. The lungs are clear bilaterally without evidence of focal consolidation, pleural effusion, or  pneumothorax. No acute osseous abnormality. IMPRESSION: No acute cardiopulmonary process. Electronically Signed   By: Marliss Coots M.D.   On: 10/09/2021 13:57   ? ?Scheduled Meds: ? aspirin  81 mg Oral Daily  ? Eslicarbazepine Acetate  1 tablet Oral Daily  ? ipratropium-albuterol  3 mL Nebulization Q6H  ? lacosamide  200 mg Oral Daily  ? montelukast  10 mg Oral QHS  ? pantoprazole  40 mg Oral Daily  ? predniSONE  40 mg Oral Q breakfast  ? simvastatin  20 mg Oral QHS  ? ?Continuous Infusions: ? azithromycin    ? cefTRIAXone (ROCEPHIN)  IV    ? ? ? LOS: 1 day  ? ?Burnadette Pop, MD ?[facility] ?P3/26/2023, 7:41 AM   ?

## 2021-10-11 NOTE — Progress Notes (Signed)
Notified from lab of PO2<31- in turn I notified [facility] on call. Pt sleeping O2 sat 95% on room air ?

## 2021-10-12 LAB — CBC WITH DIFFERENTIAL/PLATELET
Abs Immature Granulocytes: 0.05 10*3/uL (ref 0.00–0.07)
Basophils Absolute: 0 10*3/uL (ref 0.0–0.1)
Basophils Relative: 0 %
Eosinophils Absolute: 0.1 10*3/uL (ref 0.0–0.5)
Eosinophils Relative: 0 %
HCT: 28.5 % — ABNORMAL LOW (ref 36.0–46.0)
Hemoglobin: 9.8 g/dL — ABNORMAL LOW (ref 12.0–15.0)
Immature Granulocytes: 0 %
Lymphocytes Relative: 16 %
Lymphs Abs: 1.8 10*3/uL (ref 0.7–4.0)
MCH: 29.4 pg (ref 26.0–34.0)
MCHC: 34.4 g/dL (ref 30.0–36.0)
MCV: 85.6 fL (ref 80.0–100.0)
Monocytes Absolute: 0.9 10*3/uL (ref 0.1–1.0)
Monocytes Relative: 8 %
Neutro Abs: 8.3 10*3/uL — ABNORMAL HIGH (ref 1.7–7.7)
Neutrophils Relative %: 76 %
Platelets: 248 10*3/uL (ref 150–400)
RBC: 3.33 MIL/uL — ABNORMAL LOW (ref 3.87–5.11)
RDW: 12.5 % (ref 11.5–15.5)
WBC: 11.2 10*3/uL — ABNORMAL HIGH (ref 4.0–10.5)
nRBC: 0 % (ref 0.0–0.2)

## 2021-10-12 LAB — BASIC METABOLIC PANEL
Anion gap: 7 (ref 5–15)
BUN: 13 mg/dL (ref 8–23)
CO2: 26 mmol/L (ref 22–32)
Calcium: 8.5 mg/dL — ABNORMAL LOW (ref 8.9–10.3)
Chloride: 99 mmol/L (ref 98–111)
Creatinine, Ser: 0.78 mg/dL (ref 0.44–1.00)
GFR, Estimated: >60 mL/min (ref >60)
Glucose, Bld: 111 mg/dL — ABNORMAL HIGH (ref 70–99)
Potassium: 3.4 mmol/L — ABNORMAL LOW (ref 3.5–5.1)
Sodium: 132 mmol/L — ABNORMAL LOW (ref 135–145)

## 2021-10-12 LAB — MYCOPLASMA PNEUMONIAE ANTIBODY, IGM: Mycoplasma pneumo IgM: <770 U/mL (ref 0–769)

## 2021-10-12 MED ORDER — POTASSIUM CHLORIDE CRYS ER 20 MEQ PO TBCR: 40.0000 meq | EXTENDED_RELEASE_TABLET | Freq: Every day | ORAL | 0 refills | Status: AC

## 2021-10-12 MED ORDER — AZITHROMYCIN 500 MG PO TABS: 500.0000 mg | ORAL_TABLET | Freq: Every day | ORAL | 0 refills | Status: AC

## 2021-10-12 MED ORDER — GUAIFENESIN ER 600 MG PO TB12: 600.0000 mg | ORAL_TABLET | Freq: Two times a day (BID) | ORAL | 0 refills | Status: AC

## 2021-10-12 MED ORDER — CEFDINIR 300 MG PO CAPS
300.0000 mg | ORAL_CAPSULE | Freq: Two times a day (BID) | ORAL | 0 refills | Status: AC
Start: 2021-10-12 — End: 2021-10-15

## 2021-10-12 MED ORDER — BISACODYL 10 MG RE SUPP
10.0000 mg | Freq: Once | RECTAL | Status: AC
  Administered 2021-10-12: 10 mg via RECTAL
  Filled 2021-10-12: qty 1

## 2021-10-12 MED ORDER — PREDNISONE 20 MG PO TABS: 40.0000 mg | ORAL_TABLET | Freq: Every day | ORAL | 0 refills | Status: AC

## 2021-10-12 MED ORDER — SODIUM CHLORIDE 1 G PO TABS: 2.0000 g | ORAL_TABLET | Freq: Two times a day (BID) | ORAL | 0 refills | Status: AC

## 2021-10-12 MED ORDER — POTASSIUM CHLORIDE CRYS ER 20 MEQ PO TBCR
40.0000 meq | EXTENDED_RELEASE_TABLET | Freq: Once | ORAL | Status: AC
  Administered 2021-10-12: 40 meq via ORAL
  Filled 2021-10-12: qty 2

## 2021-10-12 MED ORDER — POLYETHYLENE GLYCOL 3350 17 G PO PACK: 17.0000 g | PACK | Freq: Every day | ORAL | 0 refills | Status: AC

## 2021-10-12 MED ORDER — POLYETHYLENE GLYCOL 3350 17 G PO PACK: 17.0000 g | PACK | Freq: Every day | ORAL | Status: DC

## 2021-10-12 NOTE — Discharge Summary (Signed)
Physician Discharge Summary  ?Angela Giles PXT:[id number] DOB: 09-06-1955 DOA: 10/10/2021 ? ?PCP: Georgina Quint, MD ? ?Admit date: 10/10/2021 ?Discharge date: 10/12/2021 ? ?Admitted From: Home ?Disposition:  Home ? ?Discharge Condition:Stable ?CODE STATUS:FULL ?Diet recommendation: Heart Healthy ? ?Brief/Interim Summary: ? ?Patient is a 66 year old female with history of COPD, hyperlipidemia, seizure disorder, chronic hyponatremia with baseline sodium of 130 who presented to the emergency department with complaints of cough, shortness of breath, peripheral edema, subjective fever.  On presentation, patient was febrile but saturating fine on room air.  Lab work showed sodium of 127, potassium was 2.6, WBC of 20.2, magnesium of 1.5.  COVID/influenza PCR negative.  Chest x-ray on presentation did not show any acute cardiopulmonary process, but repeat chest x-ray showed right middle lobe pneumonia..  Patient was admitted for the management of suspected pneumonia/COPD exacerbation.  Respiratory status has significantly improved.  Currently on room air.  She is medically stable for discharge to home with oral antibiotics. ? ?Following problems were addressed during her hospitalization: ? ?Sepsis due to community-acquired pneumonia: Presented with cough, shortness of breath, fever.  Tachycardic on presentation.  Lab work showed leukocytosis.  Initial chest x-ray did not show any acute cardiopulmonary process but repeat chest x-ray showed medial lobe pneumonia on the right, mild interstitial opacities.  Started on ceftriaxone, azithromycin.  Blood cultures collected,NGTD.  ?Abx changed to oral today. ?  ?COPD exacerbation: Likely secondary to pneumonia.  Patient is a past smoker.  Continue prednisone, continue bronchodilators at home.   ?  ?Acute hypoxic respiratory failure: Secondary to pneumonia/COPD exacerbation.  Initially required 2 L of oxygen per minute.  Not on oxygen at home.  Currently on room air ?  ?Severe  hypokalemia/hypomagnesemia: Supplemented ? ?Acute on chronic hyponatremia: Baseline sodium of 130.  Presented with sodium of 127.  Suspected SIADH.Started on salt tabs ?  ?Hyperlipidemia: Simvastatin ?  ?Seizure disorder: Takes Vimpat, Aptiom as an outpatient ?  ?GERD: Continue PPI ? ? ?  ? ?Discharge Diagnoses:  ?Principal Problem: ?  CAP (community acquired pneumonia) ?Active Problems: ?  History of seizure disorder ?  Sepsis (HCC) ?  Hypokalemia ?  Hypomagnesemia ?  COPD with acute exacerbation (HCC) ?  Hyponatremia ?  High cholesterol ?  GERD (gastroesophageal reflux disease) ? ? ? ?Discharge Instructions ? ?Discharge Instructions   ? ? Diet - low sodium heart healthy   Complete by: As directed ?  ? Discharge instructions   Complete by: As directed ?  ? 1)Please take prescribed medications as instructed ?2)Follow up with your PCP in a week  ? Increase activity slowly   Complete by: As directed ?  ? ?  ? ?Allergies as of 10/12/2021   ? ?   Reactions  ? Beef-derived Products   ? Fish Allergy   ? Milk-related Compounds   ? ?  ? ?  ?Medication List  ?  ? ?STOP taking these medications   ? ?guaiFENesin 100 MG/5ML liquid ?Commonly known as: ROBITUSSIN ?Replaced by: guaiFENesin 600 MG 12 hr tablet ?  ? ?  ? ?TAKE these medications   ? ?albuterol 108 (90 Base) MCG/ACT inhaler ?Commonly known as: VENTOLIN HFA ?1-2 puffs every 4 (four) hours as needed for shortness of breath or wheezing. ?  ?albuterol (2.5 MG/3ML) 0.083% nebulizer solution ?Commonly known as: PROVENTIL ?Take 3 mLs (2.5 mg total) by nebulization every 6 (six) hours as needed for wheezing or shortness of breath. ?  ?Aptiom 400 MG Tabs ?Generic drug: Eslicarbazepine  Acetate ?Take 1 tablet (400 mg total) by mouth daily. ?  ?aspirin 81 MG EC tablet ?Take 81 mg by mouth daily. Swallow whole. ?  ?Atrovent HFA 17 MCG/ACT inhaler ?Generic drug: ipratropium ?Inhale 2 puffs into the lungs every 6 (six) hours as needed for wheezing. ?  ?azithromycin 500 MG  tablet ?Commonly known as: Zithromax ?Take 1 tablet (500 mg total) by mouth daily for 3 days. Take 1 tablet daily for 3 days. ?What changed:  ?medication strength ?how much to take ?additional instructions ?  ?cefdinir 300 MG capsule ?Commonly known as: OMNICEF ?Take 1 capsule (300 mg total) by mouth 2 (two) times daily for 3 days. ?  ?diphenhydrAMINE 25 MG tablet ?Commonly known as: BENADRYL ?Take 25 mg by mouth at bedtime as needed for allergies. ?  ?guaiFENesin 600 MG 12 hr tablet ?Commonly known as: MUCINEX ?Take 1 tablet (600 mg total) by mouth 2 (two) times daily for 7 days. ?Replaces: guaiFENesin 100 MG/5ML liquid ?  ?hydrochlorothiazide 25 MG tablet ?Commonly known as: HYDRODIURIL ?Take 25 mg by mouth daily. ?  ?hydrOXYzine 25 MG tablet ?Commonly known as: ATARAX ?Take 1 tablet (25 mg total) by mouth at bedtime as needed. ?What changed: reasons to take this ?  ?ibuprofen 600 MG tablet ?Commonly known as: ADVIL ?Take 1 tablet (600 mg total) by mouth every 6 (six) hours as needed. ?  ?lacosamide 200 MG Tabs tablet ?Commonly known as: Vimpat ?Take 1 tablet (200 mg total) by mouth 2 (two) times daily. ?What changed: when to take this ?  ?Melatonin 5 MG Caps ?Take 5 mg by mouth at bedtime as needed (sleep). ?  ?methocarbamol 500 MG tablet ?Commonly known as: ROBAXIN ?Take 1 tablet (500 mg total) by mouth 2 (two) times daily as needed for muscle spasms. ?  ?montelukast 5 MG chewable tablet ?Commonly known as: SINGULAIR ?Chew 10 mg by mouth at bedtime. ?  ?omeprazole 20 MG capsule ?Commonly known as: PRILOSEC ?Take 20 mg by mouth daily. ?  ?polyethylene glycol 17 g packet ?Commonly known as: MiraLax ?Take 17 g by mouth daily. ?  ?potassium chloride SA 20 MEQ tablet ?Commonly known as: KLOR-CON M ?Take 2 tablets (40 mEq total) by mouth daily for 3 days. ?Start taking on: October 13, 2021 ?  ?predniSONE 20 MG tablet ?Commonly known as: DELTASONE ?Take 2 tablets (40 mg total) by mouth daily with breakfast for 3  days. ?Start taking on: October 13, 2021 ?What changed:  ?how much to take ?when to take this ?  ?simvastatin 20 MG tablet ?Commonly known as: ZOCOR ?Take 20 mg by mouth at bedtime. ?  ?sodium chloride 1 g tablet ?Take 2 tablets (2 g total) by mouth 2 (two) times daily with a meal for 3 days. ?  ?Symbicort 160-4.5 MCG/ACT inhaler ?Generic drug: budesonide-formoterol ?Inhale 2 puffs into the lungs in the morning and at bedtime. ?  ? ?  ? ? Follow-up Information   ? ? Georgina QuintSagardia, Miguel Jose, MD. Schedule an appointment as soon as possible for a visit in 1 week(s).   ?Specialty: Internal Medicine ?Contact information: ?[address] ?[place] [address] ?[phone] ? ? ?  ?  ? ?  ?  ? ?  ? ?Allergies  ?Allergen Reactions  ? Beef-Derived Products   ? Fish Allergy   ? Milk-Related Compounds   ? ? ?Consultations: ?None ? ? ?Procedures/Studies: ?DG Chest 2 View ? ?Result Date: 10/10/2021 ?CLINICAL DATA:  cough, fever, vomiting EXAM: CHEST - 2  VIEW COMPARISON:  Radiograph 10/09/2021 FINDINGS: Unchanged cardiomediastinal silhouette. There are mild diffuse interstitial opacities. There is increased airspace disease in the right middle lobe. No pleural effusion. No pneumothorax. No acute osseous abnormality. IMPRESSION: Right middle lobe pneumonia. Mild diffuse interstitial opacities, possibly superimposed mild interstitial edema. Electronically Signed   By: Caprice Renshaw M.D.   On: 10/10/2021 18:51  ? ?DG Chest 2 View ? ?Result Date: 10/09/2021 ?CLINICAL DATA:  67 year old female with chest pain. EXAM: CHEST - 2 VIEW COMPARISON:  06/14/2021 FINDINGS: The mediastinal contours are within normal limits. No cardiomegaly. The lungs are clear bilaterally without evidence of focal consolidation, pleural effusion, or pneumothorax. No acute osseous abnormality. IMPRESSION: No acute cardiopulmonary process. Electronically Signed   By: Marliss Coots M.D.   On: 10/09/2021 13:57   ? ? ? ?Subjective: ?Patient seen and examined at the  bedside this morning.  Hemodynamically stable for discharge today. ? ?Discharge Exam: ?Vitals:  ? 10/12/21 0427 10/12/21 0838  ?BP: 114/71 127/65  ?Pulse: 67 75  ?Resp: 19 18  ?Temp: 98.9 ?F (37.2 ?C) 98.4 ?F (36.9 ?C)  ?

## 2021-10-13 ENCOUNTER — Telehealth: Payer: Self-pay

## 2021-10-13 LAB — URINE CULTURE

## 2021-10-13 LAB — LEGIONELLA PNEUMOPHILA SEROGP 1 UR AG: L. pneumophila Serogp 1 Ur Ag: NEGATIVE

## 2021-10-13 NOTE — Telephone Encounter (Signed)
Transition Care Management Follow-up Telephone Call ?Date of discharge and from where: T[facility] 10-12-21 Dx: CAP ?How have you been since you were released from the hospital? Doing ok  ?Any questions or concerns? No ? ?Items Reviewed: ?Did the pt receive and understand the discharge instructions provided? Yes  ?Medications obtained and verified? Yes  ?Other? No  ?Any new allergies since your discharge? No  ?Dietary orders reviewed? Yes ?Do you have support at home? Yes  ? ?Home Care and Equipment/Supplies: ?Were home health services ordered? no ?If so, what is the name of the agency? na  ?Has the agency set up a time to come to the patient's home? not applicable ?Were any new equipment or medical supplies ordered?  No ?What is the name of the medical supply agency? na ?Were you able to get the supplies/equipment? not applicable ?Do you have any questions related to the use of the equipment or supplies? No ? ?Functional Questionnaire: (I = Independent and D = Dependent) ?ADLs: I ? ?Bathing/Dressing- I ? ?Meal Prep- I ? ?Eating- I ? ?Maintaining continence- I ? ?Transferring/Ambulation- I ? ?Managing Meds- I ? ?Follow up appointments reviewed: ? ?PCP Hospital f/u appt confirmed? Yes  Scheduled to see Dr Alvy Bimler on 10-19-21 @ 220pm. ?Specialist Hospital f/u appt confirmed? No . ?Are transportation arrangements needed? No  ?If their condition worsens, is the pt aware to call PCP or go to the Emergency Dept.? Yes ?Was the patient provided with contact information for the PCP's office or ED? Yes ?Was to pt encouraged to call back with questions or concerns? Yes  ?

## 2021-10-15 LAB — CULTURE, BLOOD (ROUTINE X 2)
Culture: NO GROWTH
Culture: NO GROWTH
Special Requests: ADEQUATE
Special Requests: ADEQUATE

## 2021-10-19 ENCOUNTER — Telehealth: Payer: Self-pay | Admitting: *Deleted

## 2021-10-19 ENCOUNTER — Ambulatory Visit (INDEPENDENT_AMBULATORY_CARE_PROVIDER_SITE_OTHER): Payer: Medicare Other | Admitting: Emergency Medicine

## 2021-10-19 ENCOUNTER — Ambulatory Visit (INDEPENDENT_AMBULATORY_CARE_PROVIDER_SITE_OTHER): Payer: Medicare Other

## 2021-10-19 ENCOUNTER — Encounter: Payer: Self-pay | Admitting: Emergency Medicine

## 2021-10-19 VITALS — BP 130/68 | HR 97 | Temp 98.4°F | Ht 60.0 in | Wt 140.4 lb

## 2021-10-19 DIAGNOSIS — I10 Essential (primary) hypertension: Secondary | ICD-10-CM

## 2021-10-19 DIAGNOSIS — Z09 Encounter for follow-up examination after completed treatment for conditions other than malignant neoplasm: Secondary | ICD-10-CM

## 2021-10-19 DIAGNOSIS — J189 Pneumonia, unspecified organism: Secondary | ICD-10-CM

## 2021-10-19 DIAGNOSIS — E222 Syndrome of inappropriate secretion of antidiuretic hormone: Secondary | ICD-10-CM | POA: Insufficient documentation

## 2021-10-19 DIAGNOSIS — J449 Chronic obstructive pulmonary disease, unspecified: Secondary | ICD-10-CM

## 2021-10-19 LAB — COMPREHENSIVE METABOLIC PANEL
ALT: 12 U/L (ref 0–35)
AST: 15 U/L (ref 0–37)
Albumin: 4.3 g/dL (ref 3.5–5.2)
Alkaline Phosphatase: 89 U/L (ref 39–117)
BUN: 17 mg/dL (ref 6–23)
CO2: 27 mEq/L (ref 19–32)
Calcium: 9.4 mg/dL (ref 8.4–10.5)
Chloride: 100 mEq/L (ref 96–112)
Creatinine, Ser: 0.82 mg/dL (ref 0.40–1.20)
GFR: 75.91 mL/min (ref >60.00)
Glucose, Bld: 98 mg/dL (ref 70–99)
Potassium: 3.8 mEq/L (ref 3.5–5.1)
Sodium: 135 mEq/L (ref 135–145)
Total Bilirubin: 0.3 mg/dL (ref 0.2–1.2)
Total Protein: 7.3 g/dL (ref 6.0–8.3)

## 2021-10-19 LAB — CBC WITH DIFFERENTIAL/PLATELET
Basophils Absolute: 0.1 10*3/uL (ref 0.0–0.1)
Basophils Relative: 0.9 % (ref 0.0–3.0)
Eosinophils Absolute: 0.2 10*3/uL (ref 0.0–0.7)
Eosinophils Relative: 1.8 % (ref 0.0–5.0)
HCT: 35.7 % — ABNORMAL LOW (ref 36.0–46.0)
Hemoglobin: 12.5 g/dL (ref 12.0–15.0)
Lymphocytes Relative: 21.3 % (ref 12.0–46.0)
Lymphs Abs: 2.1 10*3/uL (ref 0.7–4.0)
MCHC: 34.9 g/dL (ref 30.0–36.0)
MCV: 85.3 fl (ref 78.0–100.0)
Monocytes Absolute: 0.5 10*3/uL (ref 0.1–1.0)
Monocytes Relative: 5.5 % (ref 3.0–12.0)
Neutro Abs: 6.9 10*3/uL (ref 1.4–7.7)
Neutrophils Relative %: 70.5 % (ref 43.0–77.0)
Platelets: 451 10*3/uL — ABNORMAL HIGH (ref 150.0–400.0)
RBC: 4.18 Mil/uL (ref 3.87–5.11)
RDW: 13.1 % (ref 11.5–15.5)
WBC: 9.8 10*3/uL (ref 4.0–10.5)

## 2021-10-19 MED ORDER — LOSARTAN POTASSIUM 50 MG PO TABS: 50.0000 mg | ORAL_TABLET | Freq: Every day | ORAL | 3 refills | Status: DC

## 2021-10-19 NOTE — Telephone Encounter (Signed)
Called patient to inform her of provider response to her question pertaining to increasing her fluid intake . Patient verbalize understanding ? ?

## 2021-10-19 NOTE — Progress Notes (Signed)
Angela Giles ?66 y.o. ? ? ?Chief Complaint  ?Patient presents with  ? Hospitalization Follow-up  ? ? ?HISTORY OF PRESENT ILLNESS: ?This is a 66 y.o. female here for hospital discharge follow-up ?Was admitted with community-acquired pneumonia and sepsis. ?Found to have hyponatremia.  Suspected SIADH. ?History of COPD. ?Doing better.  Much improved. ?Discharge summary as follows: ? ?Physician Discharge Summary  ?Angela Giles WPY:[id number] DOB: 18-Dec-1955 DOA: 10/10/2021 ?  ?PCP: Georgina Quint, MD ?  ?Admit date: 10/10/2021 ?Discharge date: 10/12/2021 ?  ?Admitted From: Home ?Disposition:  Home ?  ?Discharge Condition:Stable ?CODE STATUS:FULL ?Diet recommendation: Heart Healthy ?  ?Brief/Interim Summary: ?  ?Patient is a 66 year old female with history of COPD, hyperlipidemia, seizure disorder, chronic hyponatremia with baseline sodium of 130 who presented to the emergency department with complaints of cough, shortness of breath, peripheral edema, subjective fever.  On presentation, patient was febrile but saturating fine on room air.  Lab work showed sodium of 127, potassium was 2.6, WBC of 20.2, magnesium of 1.5.  COVID/influenza PCR negative.  Chest x-ray on presentation did not show any acute cardiopulmonary process, but repeat chest x-ray showed right middle lobe pneumonia..  Patient was admitted for the management of suspected pneumonia/COPD exacerbation.  Respiratory status has significantly improved.  Currently on room air.  She is medically stable for discharge to home with oral antibiotics. ?  ?Following problems were addressed during her hospitalization: ?  ?Sepsis due to community-acquired pneumonia: Presented with cough, shortness of breath, fever.  Tachycardic on presentation.  Lab work showed leukocytosis.  Initial chest x-ray did not show any acute cardiopulmonary process but repeat chest x-ray showed medial lobe pneumonia on the right, mild interstitial opacities.  Started on ceftriaxone,  azithromycin.  Blood cultures collected,NGTD.  ?Abx changed to oral today. ?  ?COPD exacerbation: Likely secondary to pneumonia.  Patient is a past smoker.  Continue prednisone, continue bronchodilators at home.   ?  ?Acute hypoxic respiratory failure: Secondary to pneumonia/COPD exacerbation.  Initially required 2 L of oxygen per minute.  Not on oxygen at home.  Currently on room air ?  ?Severe hypokalemia/hypomagnesemia: Supplemented ?  ?Acute on chronic hyponatremia: Baseline sodium of 130.  Presented with sodium of 127.  Suspected SIADH.Started on salt tabs ?  ?Hyperlipidemia: Simvastatin ?  ?Seizure disorder: Takes Vimpat, Aptiom as an outpatient ?  ?GERD: Continue PPI ?  ?  ?  ?  ?Discharge Diagnoses:  ?Principal Problem: ?  CAP (community acquired pneumonia) ?Active Problems: ?  History of seizure disorder ?  Sepsis (HCC) ?  Hypokalemia ?  Hypomagnesemia ?  COPD with acute exacerbation (HCC) ?  Hyponatremia ?  High cholesterol ?  GERD (gastroesophageal reflux disease) ?  ?HPI ? ? ?Prior to Admission medications   ?Medication Sig Start Date End Date Taking? Authorizing Provider  ?albuterol (PROVENTIL) (2.5 MG/3ML) 0.083% nebulizer solution Take 3 mLs (2.5 mg total) by nebulization every 6 (six) hours as needed for wheezing or shortness of breath. 10/09/21  Yes Curatolo, Adam, DO  ?albuterol (VENTOLIN HFA) 108 (90 Base) MCG/ACT inhaler 1-2 puffs every 4 (four) hours as needed for shortness of breath or wheezing. 05/08/21  Yes [provider]  ?APTIOM 400 MG TABS Take 1 tablet (400 mg total) by mouth daily. 06/16/21  Yes Van Clines, MD  ?aspirin 81 MG EC tablet Take 81 mg by mouth daily. Swallow whole.   Yes [provider]  ?diphenhydrAMINE (BENADRYL) 25 MG tablet Take 25 mg by mouth  at bedtime as needed for allergies.   Yes [provider]  ?guaiFENesin (MUCINEX) 600 MG 12 hr tablet Take 1 tablet (600 mg total) by mouth 2 (two) times daily for 7 days. 10/12/21 10/19/21 Yes Burnadette Pop, MD  ?hydrochlorothiazide (HYDRODIURIL) 25 MG tablet Take 25 mg by mouth daily.   Yes [provider]  ?hydrOXYzine (ATARAX) 25 MG tablet Take 1 tablet (25 mg total) by mouth at bedtime as needed. ?Patient taking differently: Take 25 mg by mouth at bedtime as needed for anxiety. 09/28/21  Yes Gaege Sangalang, Eilleen Kempf, MD  ?ibuprofen (ADVIL) 600 MG tablet Take 1 tablet (600 mg total) by mouth every 6 (six) hours as needed. 03/14/21  Yes Tanda Rockers A, DO  ?ipratropium (ATROVENT HFA) 17 MCG/ACT inhaler Inhale 2 puffs into the lungs every 6 (six) hours as needed for wheezing. 03/14/21  Yes Tanda Rockers A, DO  ?lacosamide (VIMPAT) 200 MG TABS tablet Take 1 tablet (200 mg total) by mouth 2 (two) times daily. ?Patient taking differently: Take 200 mg by mouth daily. 06/16/21  Yes Van Clines, MD  ?Melatonin 5 MG CAPS Take 5 mg by mouth at bedtime as needed (sleep).   Yes [provider]  ?montelukast (SINGULAIR) 5 MG chewable tablet Chew 10 mg by mouth at bedtime. 03/19/21  Yes [provider]  ?omeprazole (PRILOSEC) 20 MG capsule Take 20 mg by mouth daily.   Yes [provider]  ?polyethylene glycol (MIRALAX) 17 g packet Take 17 g by mouth daily. 10/12/21  Yes Burnadette Pop, MD  ?simvastatin (ZOCOR) 20 MG tablet Take 20 mg by mouth at bedtime. 05/08/21  Yes [provider]  ?SYMBICORT 160-4.5 MCG/ACT inhaler Inhale 2 puffs into the lungs in the morning and at bedtime. 09/24/21  Yes [provider]  ?methocarbamol (ROBAXIN) 500 MG tablet Take 1 tablet (500 mg total) by mouth 2 (two) times daily as needed for muscle spasms. ?Patient not taking: Reported on 06/14/2021 03/14/21   Tanda Rockers A, DO  ?potassium chloride SA (KLOR-CON M) 20 MEQ tablet Take 2 tablets (40 mEq total) by mouth daily for 3 days. 10/13/21 10/16/21  Burnadette Pop, MD  ? ? ?Allergies  ?Allergen Reactions  ? Beef-Derived Products   ? Fish Allergy   ? Milk-Related Compounds   ? ? ?Patient Active  Problem List  ? Diagnosis Date Noted  ? CAP (community acquired pneumonia) 10/10/2021  ? Sepsis (HCC) 10/10/2021  ? Hypokalemia 10/10/2021  ? Hypomagnesemia 10/10/2021  ? COPD with acute exacerbation (HCC) 10/10/2021  ? Hyponatremia 10/10/2021  ? High cholesterol   ? GERD (gastroesophageal reflux disease)   ? Chronic pain syndrome 09/28/2021  ? Essential hypertension 09/28/2021  ? Chronic obstructive pulmonary disease (HCC) 09/28/2021  ? History of seizure disorder 09/28/2021  ? Insomnia 09/28/2021  ? Chronic bilateral low back pain without sciatica 09/28/2021  ? ? ?Past Medical History:  ?Diagnosis Date  ? Chronic back pain   ? COPD (chronic obstructive pulmonary disease) (HCC)   ? Dorsalgia   ? High cholesterol   ? Hypertension   ? Seizures (HCC)   ? ? ?Past Surgical History:  ?Procedure Laterality Date  ? ABDOMINAL HYSTERECTOMY    ? CHOLECYSTECTOMY    ? ? ?Social History  ? ?Socioeconomic History  ? Marital status: Divorced  ?  Spouse name: Not on file  ? Number of children: 3  ? Years of education: Not on file  ? Highest education level: Not on file  ?Occupational  History  ? Not on file  ?Tobacco Use  ? Smoking status: Former  ? Smokeless tobacco: Never  ?Vaping Use  ? Vaping Use: Never used  ?Substance and Sexual Activity  ? Alcohol use: Never  ? Drug use: Never  ? Sexual activity: Not on file  ?Other Topics Concern  ? Not on file  ?Social History Narrative  ? Lives in a one story home with a basement   ? Right handed   ? ?Social Determinants of Health  ? ?Financial Resource Strain: Not on file  ?Food Insecurity: Not on file  ?Transportation Needs: Not on file  ?Physical Activity: Not on file  ?Stress: Not on file  ?Social Connections: Not on file  ?Intimate Partner Violence: Not on file  ? ? ?Family History  ?Problem Relation Age of Onset  ? Dementia Mother   ? ? ? ?Review of Systems  ?Constitutional: Negative.  Negative for chills and fever.  ?HENT: Negative.  Negative for congestion and sore throat.    ?Respiratory: Negative.  Negative for cough, hemoptysis, shortness of breath and wheezing.   ?Cardiovascular: Negative.  Negative for chest pain.  ?Gastrointestinal:  Negative for abdominal pain, diarrhea, nausea a

## 2021-10-19 NOTE — Telephone Encounter (Signed)
Patient was in office and wanted to ask if she can increase her fluid intake. Patient currently drinking 32 oz or more a day. She was wondering is she can increase that. Please advise  ? ? ?

## 2021-10-19 NOTE — Telephone Encounter (Signed)
Yes she can. Thanks! 

## 2021-10-19 NOTE — Assessment & Plan Note (Addendum)
Found to have hyponatremia.  Blood work done today. ?Stop hydrochlorothiazide.  Start losartan 50 mg daily. ?

## 2021-10-19 NOTE — Patient Instructions (Addendum)
Stop hydrochlorothiazide. ?Instead take losartan 50 mg for blood pressure. ? ?Health Maintenance, Female ?Adopting a healthy lifestyle and getting preventive care are important in promoting health and wellness. Ask your health care provider about: ?The right schedule for you to have regular tests and exams. ?Things you can do on your own to prevent diseases and keep yourself healthy. ?What should I know about diet, weight, and exercise? ?Eat a healthy diet ? ?Eat a diet that includes plenty of vegetables, fruits, low-fat dairy products, and lean protein. ?Do not eat a lot of foods that are high in solid fats, added sugars, or sodium. ?Maintain a healthy weight ?Body mass index (BMI) is used to identify weight problems. It estimates body fat based on height and weight. Your health care provider can help determine your BMI and help you achieve or maintain a healthy weight. ?Get regular exercise ?Get regular exercise. This is one of the most important things you can do for your health. Most adults should: ?Exercise for at least 150 minutes each week. The exercise should increase your heart rate and make you sweat (moderate-intensity exercise). ?Do strengthening exercises at least twice a week. This is in addition to the moderate-intensity exercise. ?Spend less time sitting. Even light physical activity can be beneficial. ?Watch cholesterol and blood lipids ?Have your blood tested for lipids and cholesterol at 64 years of age, then have this test every 5 years. ?Have your cholesterol levels checked more often if: ?Your lipid or cholesterol levels are high. ?You are older than 64 years of age. ?You are at high risk for heart disease. ?What should I know about cancer screening? ?Depending on your health history and family history, you may need to have cancer screening at various ages. This may include screening for: ?Breast cancer. ?Cervical cancer. ?Colorectal cancer. ?Skin cancer. ?Lung cancer. ?What should I know about  heart disease, diabetes, and high blood pressure? ?Blood pressure and heart disease ?High blood pressure causes heart disease and increases the risk of stroke. This is more likely to develop in people who have high blood pressure readings or are overweight. ?Have your blood pressure checked: ?Every 3-5 years if you are 11-58 years of age. ?Every year if you are 3 years old or older. ?Diabetes ?Have regular diabetes screenings. This checks your fasting blood sugar level. Have the screening done: ?Once every three years after age 41 if you are at a normal weight and have a low risk for diabetes. ?More often and at a younger age if you are overweight or have a high risk for diabetes. ?What should I know about preventing infection? ?Hepatitis B ?If you have a higher risk for hepatitis B, you should be screened for this virus. Talk with your health care provider to find out if you are at risk for hepatitis B infection. ?Hepatitis C ?Testing is recommended for: ?Everyone born from [birth year] through 1963. ?Anyone with known risk factors for hepatitis C. ?Sexually transmitted infections (STIs) ?Get screened for STIs, including gonorrhea and chlamydia, if: ?You are sexually active and are younger than 64 years of age. ?You are older than 64 years of age and your health care provider tells you that you are at risk for this type of infection. ?Your sexual activity has changed since you were last screened, and you are at increased risk for chlamydia or gonorrhea. Ask your health care provider if you are at risk. ?Ask your health care provider about whether you are at high risk for HIV. Your health  care provider may recommend a prescription medicine to help prevent HIV infection. If you choose to take medicine to prevent HIV, you should first get tested for HIV. You should then be tested every 3 months for as long as you are taking the medicine. ?Pregnancy ?If you are about to stop having your period (premenopausal) and you may  become pregnant, seek counseling before you get pregnant. ?Take 400 to 800 micrograms (mcg) of folic acid every day if you become pregnant. ?Ask for birth control (contraception) if you want to prevent pregnancy. ?Osteoporosis and menopause ?Osteoporosis is a disease in which the bones lose minerals and strength with aging. This can result in bone fractures. If you are 45 years old or older, or if you are at risk for osteoporosis and fractures, ask your health care provider if you should: ?Be screened for bone loss. ?Take a calcium or vitamin D supplement to lower your risk of fractures. ?Be given hormone replacement therapy (HRT) to treat symptoms of menopause. ?Follow these instructions at home: ?Alcohol use ?Do not drink alcohol if: ?Your health care provider tells you not to drink. ?You are pregnant, may be pregnant, or are planning to become pregnant. ?If you drink alcohol: ?Limit how much you have to: ?0-1 drink a day. ?Know how much alcohol is in your drink. In the [place] one drink equals one 12 oz bottle of beer (355 mL), one 5 oz glass of wine (148 mL), or one 1? oz glass of hard liquor (44 mL). ?Lifestyle ?Do not use any products that contain nicotine or tobacco. These products include cigarettes, chewing tobacco, and vaping devices, such as e-cigarettes. If you need help quitting, ask your health care provider. ?Do not use street drugs. ?Do not share needles. ?Ask your health care provider for help if you need support or information about quitting drugs. ?General instructions ?Schedule regular health, dental, and eye exams. ?Stay current with your vaccines. ?Tell your health care provider if: ?You often feel depressed. ?You have ever been abused or do not feel safe at home. ?Summary ?Adopting a healthy lifestyle and getting preventive care are important in promoting health and wellness. ?Follow your health care provider's instructions about healthy diet, exercising, and getting tested or screened for  diseases. ?Follow your health care provider's instructions on monitoring your cholesterol and blood pressure. ?This information is not intended to replace advice given to you by your health care provider. Make sure you discuss any questions you have with your health care provider. ?Document Revised: 11/24/2020 Document Reviewed: 11/24/2020 ?Elsevier Patient Education ? 2022 [employer] ? ?

## 2021-10-19 NOTE — Assessment & Plan Note (Addendum)
Well-controlled. ?BP Readings from Last 3 Encounters:  ?10/19/21 130/68  ?10/12/21 127/65  ?10/09/21 (!) 141/67  ?Stop hydrochlorothiazide due to hyponatremia and start losartan 50 mg daily. ? ?

## 2021-10-19 NOTE — Assessment & Plan Note (Addendum)
Much improved.  Asymptomatic. ?Still taking cefdinir.  Finished Zithromax. ?Chest x-ray done today. ?

## 2021-10-20 ENCOUNTER — Ambulatory Visit: Payer: Medicare Other | Admitting: Family

## 2021-10-21 ENCOUNTER — Other Ambulatory Visit: Payer: Self-pay | Admitting: Emergency Medicine

## 2021-10-21 DIAGNOSIS — G4700 Insomnia, unspecified: Secondary | ICD-10-CM

## 2021-11-10 ENCOUNTER — Ambulatory Visit
Admission: RE | Admit: 2021-11-10 | Discharge: 2021-11-10 | Disposition: A | Discharge status: Home / Self Care | Payer: Medicare Other | Source: Ambulatory Visit | Attending: Emergency Medicine | Admitting: Emergency Medicine

## 2021-11-10 DIAGNOSIS — J189 Pneumonia, unspecified organism: Secondary | ICD-10-CM

## 2021-11-10 DIAGNOSIS — J449 Chronic obstructive pulmonary disease, unspecified: Secondary | ICD-10-CM

## 2021-11-12 ENCOUNTER — Telehealth: Payer: Self-pay | Admitting: *Deleted

## 2021-11-12 DIAGNOSIS — J189 Pneumonia, unspecified organism: Secondary | ICD-10-CM

## 2021-11-12 DIAGNOSIS — J449 Chronic obstructive pulmonary disease, unspecified: Secondary | ICD-10-CM

## 2021-11-12 NOTE — Progress Notes (Signed)
Prefer OV and x-ray in 4 weeks.  Thank you

## 2021-11-12 NOTE — Telephone Encounter (Signed)
Okay. Thank you.

## 2021-11-12 NOTE — Telephone Encounter (Signed)
Pt schedule for OV and x ray on 12/10/2021. Pt will get x ray done before her 11:00 appt  ?

## 2021-11-16 ENCOUNTER — Ambulatory Visit: Payer: Medicare Other | Admitting: Emergency Medicine

## 2021-11-17 ENCOUNTER — Telehealth: Payer: Self-pay | Admitting: Family

## 2021-11-17 NOTE — Telephone Encounter (Signed)
Noted  

## 2021-11-17 NOTE — Telephone Encounter (Signed)
Patient called advised she did not come to her appointment because she was admitted to the hospital for pneumonia. Patient said she still have pneumonia and will have another set of X-rays on 12/08/2021. Patient said she can reschedule if she doesn't have pneumonia.      Patient phone [phone]  ?

## 2021-12-08 ENCOUNTER — Telehealth: Payer: Self-pay | Admitting: Emergency Medicine

## 2021-12-08 ENCOUNTER — Other Ambulatory Visit: Payer: Medicaid - Out of State

## 2021-12-08 ENCOUNTER — Ambulatory Visit (INDEPENDENT_AMBULATORY_CARE_PROVIDER_SITE_OTHER): Payer: Medicare Other

## 2021-12-08 ENCOUNTER — Encounter: Payer: Self-pay | Admitting: Emergency Medicine

## 2021-12-08 ENCOUNTER — Ambulatory Visit (INDEPENDENT_AMBULATORY_CARE_PROVIDER_SITE_OTHER): Payer: Medicare Other | Admitting: Emergency Medicine

## 2021-12-08 VITALS — BP 120/68 | HR 71 | Temp 98.4°F | Ht 60.0 in | Wt 146.4 lb

## 2021-12-08 DIAGNOSIS — I10 Essential (primary) hypertension: Secondary | ICD-10-CM | POA: Diagnosis not present

## 2021-12-08 DIAGNOSIS — J449 Chronic obstructive pulmonary disease, unspecified: Secondary | ICD-10-CM

## 2021-12-08 DIAGNOSIS — J189 Pneumonia, unspecified organism: Secondary | ICD-10-CM | POA: Diagnosis not present

## 2021-12-08 DIAGNOSIS — G894 Chronic pain syndrome: Secondary | ICD-10-CM | POA: Diagnosis not present

## 2021-12-08 MED ORDER — ALBUTEROL SULFATE HFA 108 (90 BASE) MCG/ACT IN AERS: 1.0000 | INHALATION_SPRAY | RESPIRATORY_TRACT | 3 refills | Status: DC | PRN

## 2021-12-08 MED ORDER — METHOCARBAMOL 500 MG PO TABS: 500.0000 mg | ORAL_TABLET | Freq: Two times a day (BID) | ORAL | 1 refills | Status: DC | PRN

## 2021-12-08 NOTE — Assessment & Plan Note (Signed)
Much improved.  Chest x-ray shows resolution.

## 2021-12-08 NOTE — Assessment & Plan Note (Signed)
Well-controlled hypertension.  Continue losartan 50 mg daily. BP Readings from Last 3 Encounters:  12/08/21 120/68  10/19/21 130/68  10/12/21 127/65

## 2021-12-08 NOTE — Progress Notes (Signed)
Angela Giles 66 y.o.   Chief Complaint  Patient presents with   Follow-up    HISTORY OF PRESENT ILLNESS: This is a 66 y.o. female here for 4-week follow-up. Recent pneumonia with sepsis. Slowly improving.  Better today than a couple weeks ago. No new complaints or medical concerns today.  HPI   Prior to Admission medications   Medication Sig Start Date End Date Taking? Authorizing Provider  albuterol (PROVENTIL) (2.5 MG/3ML) 0.083% nebulizer solution Take 3 mLs (2.5 mg total) by nebulization every 6 (six) hours as needed for wheezing or shortness of breath. 10/09/21  Yes Curatolo, Adam, DO  APTIOM 400 MG TABS Take 1 tablet (400 mg total) by mouth daily. 06/16/21  Yes Van Clines, MD  aspirin 81 MG EC tablet Take 81 mg by mouth daily. Swallow whole.   Yes [provider]  diphenhydrAMINE (BENADRYL) 25 MG tablet Take 25 mg by mouth at bedtime as needed for allergies.   Yes [provider]  hydrOXYzine (ATARAX) 25 MG tablet TAKE 1 TABLET BY MOUTH AT BEDTIME AS NEEDED. 10/21/21  Yes Neelam Tiggs, Eilleen Kempf, MD  ibuprofen (ADVIL) 600 MG tablet Take 1 tablet (600 mg total) by mouth every 6 (six) hours as needed. 03/14/21  Yes Tanda Rockers A, DO  ipratropium (ATROVENT HFA) 17 MCG/ACT inhaler Inhale 2 puffs into the lungs every 6 (six) hours as needed for wheezing. 03/14/21  Yes Tanda Rockers A, DO  lacosamide (VIMPAT) 200 MG TABS tablet Take 1 tablet (200 mg total) by mouth 2 (two) times daily. Patient taking differently: Take 200 mg by mouth daily. 06/16/21  Yes Van Clines, MD  losartan (COZAAR) 50 MG tablet Take 1 tablet (50 mg total) by mouth daily. 10/19/21  Yes Dorthey Depace, Eilleen Kempf, MD  Melatonin 5 MG CAPS Take 5 mg by mouth at bedtime as needed (sleep).   Yes [provider]  montelukast (SINGULAIR) 5 MG chewable tablet Chew 10 mg by mouth at bedtime. 03/19/21  Yes [provider]  omeprazole (PRILOSEC) 20 MG capsule Take 20 mg by mouth daily.   Yes  [provider]  polyethylene glycol (MIRALAX) 17 g packet Take 17 g by mouth daily. 10/12/21  Yes Burnadette Pop, MD  simvastatin (ZOCOR) 20 MG tablet Take 20 mg by mouth at bedtime. 05/08/21  Yes [provider]  SYMBICORT 160-4.5 MCG/ACT inhaler Inhale 2 puffs into the lungs in the morning and at bedtime. 09/24/21  Yes [provider]  albuterol (VENTOLIN HFA) 108 (90 Base) MCG/ACT inhaler Inhale 1-2 puffs into the lungs every 4 (four) hours as needed for shortness of breath or wheezing. 12/08/21   Georgina Quint, MD  methocarbamol (ROBAXIN) 500 MG tablet Take 1 tablet (500 mg total) by mouth 2 (two) times daily as needed for muscle spasms. 12/08/21   Georgina Quint, MD  potassium chloride SA (KLOR-CON M) 20 MEQ tablet Take 2 tablets (40 mEq total) by mouth daily for 3 days. 10/13/21 10/16/21  Burnadette Pop, MD    Allergies  Allergen Reactions   Beef-Derived Products    Fish Allergy    Milk-Related Compounds     Patient Active Problem List   Diagnosis Date Noted   SIADH (syndrome of inappropriate ADH production) (HCC) 10/19/2021   CAP (community acquired pneumonia) 10/10/2021   COPD with acute exacerbation (HCC) 10/10/2021   High cholesterol    GERD (gastroesophageal reflux disease)    Chronic pain syndrome 09/28/2021   Essential hypertension 09/28/2021  Chronic obstructive pulmonary disease (HCC) 09/28/2021   History of seizure disorder 09/28/2021   Insomnia 09/28/2021   Chronic bilateral low back pain without sciatica 09/28/2021    Past Medical History:  Diagnosis Date   Chronic back pain    COPD (chronic obstructive pulmonary disease) (HCC)    Dorsalgia    High cholesterol    Hypertension    Seizures (HCC)     Past Surgical History:  Procedure Laterality Date   ABDOMINAL HYSTERECTOMY     CHOLECYSTECTOMY      Social History   Socioeconomic History   Marital status: Divorced    Spouse name: Not on file   Number of children:  3   Years of education: Not on file   Highest education level: Not on file  Occupational History   Not on file  Tobacco Use   Smoking status: Former   Smokeless tobacco: Never  Vaping Use   Vaping Use: Never used  Substance and Sexual Activity   Alcohol use: Never   Drug use: Never   Sexual activity: Not on file  Other Topics Concern   Not on file  Social History Narrative   Lives in a one story home with a basement    Right handed    Social Determinants of Health   Financial Resource Strain: Not on file  Food Insecurity: Not on file  Transportation Needs: Not on file  Physical Activity: Not on file  Stress: Not on file  Social Connections: Not on file  Intimate Partner Violence: Not on file    Family History  Problem Relation Age of Onset   Dementia Mother      Review of Systems  Constitutional: Negative.  Negative for chills and fever.  HENT: Negative.  Negative for congestion and sore throat.   Respiratory: Negative.  Negative for cough and shortness of breath.   Cardiovascular: Negative.  Negative for chest pain and palpitations.  Gastrointestinal: Negative.  Negative for diarrhea, nausea and vomiting.  Genitourinary: Negative.   Skin: Negative.  Negative for rash.  Neurological: Negative.  Negative for dizziness and headaches.  All other systems reviewed and are negative. Today's Vitals   12/08/21 1004  BP: 120/68  Pulse: 71  Temp: 98.4 F (36.9 C)  TempSrc: Oral  SpO2: 98%  Weight: 146 lb 6 oz (66.4 kg)  Height: 5' (1.524 m)   Body mass index is 28.59 kg/m.   Physical Exam Vitals reviewed.  Constitutional:      Appearance: Normal appearance.  HENT:     Head: Normocephalic.     Mouth/Throat:     Mouth: Mucous membranes are moist.     Pharynx: Oropharynx is clear.  Eyes:     Extraocular Movements: Extraocular movements intact.     Pupils: Pupils are equal, round, and reactive to light.  Cardiovascular:     Rate and Rhythm: Normal rate and  regular rhythm.     Pulses: Normal pulses.     Heart sounds: Normal heart sounds.  Pulmonary:     Effort: Pulmonary effort is normal.     Breath sounds: Normal breath sounds.  Abdominal:     Palpations: Abdomen is soft.     Tenderness: There is no abdominal tenderness.  Musculoskeletal:     Cervical back: No tenderness.  Lymphadenopathy:     Cervical: No cervical adenopathy.  Skin:    General: Skin is warm and dry.     Capillary Refill: Capillary refill takes less than 2 seconds.  Neurological:     General: No focal deficit present.     Mental Status: She is alert and oriented to person, place, and time.  Psychiatric:        Mood and Affect: Mood normal.        Behavior: Behavior normal.    DG Chest 2 View  Result Date: 12/08/2021 CLINICAL DATA:  Follow-up pneumonia EXAM: CHEST - 2 VIEW COMPARISON:  October 19, 2021 FINDINGS: Cardiomediastinal silhouette is normal. No pneumothorax. Interval resolution of the right middle lobe pneumonia. No suspicious nodules or masses. No new infiltrates. IMPRESSION: Interval resolution of the previously identified right middle lobe pneumonia. No other abnormalities. Electronically Signed   By: Gerome Sam III M.D.   On: 12/08/2021 12:15    ASSESSMENT & PLAN: A total of 45 minutes was spent with the patient and counseling/coordination of care regarding preparing for this visit, review of most recent office visit notes, review of chronic medical problems and their management, review of today's chest x-ray, review of all medications, education on nutrition, prognosis, documentation and need for follow-up.  Problem List Items Addressed This Visit       Cardiovascular and Mediastinum   Essential hypertension    Well-controlled hypertension.  Continue losartan 50 mg daily. BP Readings from Last 3 Encounters:  12/08/21 120/68  10/19/21 130/68  10/12/21 127/65           Respiratory   Chronic obstructive pulmonary disease (HCC)    Stable.   Continue Symbicort and albuterol as rescue inhaler.       Relevant Medications   albuterol (VENTOLIN HFA) 108 (90 Base) MCG/ACT inhaler   Community acquired pneumonia of right middle lobe of lung - Primary    Much improved.  Chest x-ray shows resolution.       Relevant Medications   albuterol (VENTOLIN HFA) 108 (90 Base) MCG/ACT inhaler     Other   Chronic pain syndrome   Relevant Medications   methocarbamol (ROBAXIN) 500 MG tablet   Patient Instructions  Health Maintenance, Female Adopting a healthy lifestyle and getting preventive care are important in promoting health and wellness. Ask your health care provider about: The right schedule for you to have regular tests and exams. Things you can do on your own to prevent diseases and keep yourself healthy. What should I know about diet, weight, and exercise? Eat a healthy diet  Eat a diet that includes plenty of vegetables, fruits, low-fat dairy products, and lean protein. Do not eat a lot of foods that are high in solid fats, added sugars, or sodium. Maintain a healthy weight Body mass index (BMI) is used to identify weight problems. It estimates body fat based on height and weight. Your health care provider can help determine your BMI and help you achieve or maintain a healthy weight. Get regular exercise Get regular exercise. This is one of the most important things you can do for your health. Most adults should: Exercise for at least 150 minutes each week. The exercise should increase your heart rate and make you sweat (moderate-intensity exercise). Do strengthening exercises at least twice a week. This is in addition to the moderate-intensity exercise. Spend less time sitting. Even light physical activity can be beneficial. Watch cholesterol and blood lipids Have your blood tested for lipids and cholesterol at 64 years of age, then have this test every 5 years. Have your cholesterol levels checked more often if: Your lipid  or cholesterol levels are high. You are older than  64 years of age. You are at high risk for heart disease. What should I know about cancer screening? Depending on your health history and family history, you may need to have cancer screening at various ages. This may include screening for: Breast cancer. Cervical cancer. Colorectal cancer. Skin cancer. Lung cancer. What should I know about heart disease, diabetes, and high blood pressure? Blood pressure and heart disease High blood pressure causes heart disease and increases the risk of stroke. This is more likely to develop in people who have high blood pressure readings or are overweight. Have your blood pressure checked: Every 3-5 years if you are 3618-64 years of age. Every year if you are 312 years old or older. Diabetes Have regular diabetes screenings. This checks your fasting blood sugar level. Have the screening done: Once every three years after age 64 if you are at a normal weight and have a low risk for diabetes. More often and at a younger age if you are overweight or have a high risk for diabetes. What should I know about preventing infection? Hepatitis B If you have a higher risk for hepatitis B, you should be screened for this virus. Talk with your health care provider to find out if you are at risk for hepatitis B infection. Hepatitis C Testing is recommended for: Everyone born from [birth year] through 1963. Anyone with known risk factors for hepatitis C. Sexually transmitted infections (STIs) Get screened for STIs, including gonorrhea and chlamydia, if: You are sexually active and are younger than 64 years of age. You are older than 64 years of age and your health care provider tells you that you are at risk for this type of infection. Your sexual activity has changed since you were last screened, and you are at increased risk for chlamydia or gonorrhea. Ask your health care provider if you are at risk. Ask your health care  provider about whether you are at high risk for HIV. Your health care provider may recommend a prescription medicine to help prevent HIV infection. If you choose to take medicine to prevent HIV, you should first get tested for HIV. You should then be tested every 3 months for as long as you are taking the medicine. Pregnancy If you are about to stop having your period (premenopausal) and you may become pregnant, seek counseling before you get pregnant. Take 400 to 800 micrograms (mcg) of folic acid every day if you become pregnant. Ask for birth control (contraception) if you want to prevent pregnancy. Osteoporosis and menopause Osteoporosis is a disease in which the bones lose minerals and strength with aging. This can result in bone fractures. If you are 64 years old or older, or if you are at risk for osteoporosis and fractures, ask your health care provider if you should: Be screened for bone loss. Take a calcium or vitamin D supplement to lower your risk of fractures. Be given hormone replacement therapy (HRT) to treat symptoms of menopause. Follow these instructions at home: Alcohol use Do not drink alcohol if: Your health care provider tells you not to drink. You are pregnant, may be pregnant, or are planning to become pregnant. If you drink alcohol: Limit how much you have to: 0-1 drink a day. Know how much alcohol is in your drink. In the [place] one drink equals one 12 oz bottle of beer (355 mL), one 5 oz glass of wine (148 mL), or one 1 oz glass of hard liquor (44 mL). Lifestyle Do not use  any products that contain nicotine or tobacco. These products include cigarettes, chewing tobacco, and vaping devices, such as e-cigarettes. If you need help quitting, ask your health care provider. Do not use street drugs. Do not share needles. Ask your health care provider for help if you need support or information about quitting drugs. General instructions Schedule regular health, dental, and  eye exams. Stay current with your vaccines. Tell your health care provider if: You often feel depressed. You have ever been abused or do not feel safe at home. Summary Adopting a healthy lifestyle and getting preventive care are important in promoting health and wellness. Follow your health care provider's instructions about healthy diet, exercising, and getting tested or screened for diseases. Follow your health care provider's instructions on monitoring your cholesterol and blood pressure. This information is not intended to replace advice given to you by your health care provider. Make sure you discuss any questions you have with your health care provider. Document Revised: 11/24/2020 Document Reviewed: 11/24/2020 Elsevier Patient Education  2023 Elsevier Inc.    Edwina Barth, MD [facility]

## 2021-12-08 NOTE — Telephone Encounter (Signed)
PT calls back today in regards to a call that she had received and missed. PT was wanting to make sure this wasn't anything urgent. Please CB PT at  [facility]: [phone]

## 2021-12-08 NOTE — Assessment & Plan Note (Signed)
Stable.  Continue Symbicort and albuterol as rescue inhaler. 

## 2021-12-08 NOTE — Patient Instructions (Signed)

## 2021-12-09 NOTE — Telephone Encounter (Signed)
Called patient back to inform her of her labs results

## 2021-12-10 ENCOUNTER — Ambulatory Visit: Payer: Medicare Other | Admitting: Emergency Medicine

## 2021-12-11 ENCOUNTER — Telehealth: Payer: Self-pay | Admitting: *Deleted

## 2021-12-11 NOTE — Telephone Encounter (Signed)
[facility] sent fax stating the patient albuterol HFA inhaler is not covered by insurance. Requesting an alternative.

## 2021-12-14 NOTE — Telephone Encounter (Signed)
Need insurance to tell us what is covered.  Thanks.

## 2021-12-15 NOTE — Telephone Encounter (Signed)
Called patient and left message for patient to call back to office pertaining to her medication . Her albuterol is not covered by her insurance. Patient needs to call insurance to see what alternatives is covered.

## 2021-12-15 NOTE — Telephone Encounter (Signed)
Patient called back I  informed her that her medication was not covered . Patient will call back with alternatives

## 2021-12-17 ENCOUNTER — Other Ambulatory Visit: Payer: Self-pay

## 2021-12-17 ENCOUNTER — Emergency Department (HOSPITAL_COMMUNITY)
Admission: EM | Admit: 2021-12-17 | Discharge: 2021-12-17 | Disposition: A | Discharge status: Home / Self Care | Payer: Medicare Other | Attending: Emergency Medicine | Admitting: Emergency Medicine

## 2021-12-17 ENCOUNTER — Encounter (HOSPITAL_COMMUNITY): Payer: Self-pay

## 2021-12-17 ENCOUNTER — Telehealth: Payer: Self-pay | Admitting: Emergency Medicine

## 2021-12-17 DIAGNOSIS — Z7951 Long term (current) use of inhaled steroids: Secondary | ICD-10-CM | POA: Insufficient documentation

## 2021-12-17 DIAGNOSIS — R112 Nausea with vomiting, unspecified: Secondary | ICD-10-CM | POA: Diagnosis not present

## 2021-12-17 DIAGNOSIS — I10 Essential (primary) hypertension: Secondary | ICD-10-CM | POA: Diagnosis not present

## 2021-12-17 DIAGNOSIS — E860 Dehydration: Secondary | ICD-10-CM | POA: Insufficient documentation

## 2021-12-17 DIAGNOSIS — J449 Chronic obstructive pulmonary disease, unspecified: Secondary | ICD-10-CM | POA: Insufficient documentation

## 2021-12-17 DIAGNOSIS — Z7982 Long term (current) use of aspirin: Secondary | ICD-10-CM | POA: Insufficient documentation

## 2021-12-17 DIAGNOSIS — Z79899 Other long term (current) drug therapy: Secondary | ICD-10-CM | POA: Insufficient documentation

## 2021-12-17 DIAGNOSIS — R7989 Other specified abnormal findings of blood chemistry: Secondary | ICD-10-CM | POA: Diagnosis not present

## 2021-12-17 DIAGNOSIS — R197 Diarrhea, unspecified: Secondary | ICD-10-CM | POA: Insufficient documentation

## 2021-12-17 LAB — COMPREHENSIVE METABOLIC PANEL
ALT: 10 U/L (ref 0–44)
AST: 18 U/L (ref 15–41)
Albumin: 4.5 g/dL (ref 3.5–5.0)
Alkaline Phosphatase: 89 U/L (ref 38–126)
Anion gap: 8 (ref 5–15)
BUN: 21 mg/dL (ref 8–23)
CO2: 24 mmol/L (ref 22–32)
Calcium: 9.7 mg/dL (ref 8.9–10.3)
Chloride: 103 mmol/L (ref 98–111)
Creatinine, Ser: 0.89 mg/dL (ref 0.44–1.00)
GFR, Estimated: >60 mL/min (ref >60)
Glucose, Bld: 128 mg/dL — ABNORMAL HIGH (ref 70–99)
Potassium: 4 mmol/L (ref 3.5–5.1)
Sodium: 135 mmol/L (ref 135–145)
Total Bilirubin: 0.7 mg/dL (ref 0.3–1.2)
Total Protein: 7.3 g/dL (ref 6.5–8.1)

## 2021-12-17 LAB — URINALYSIS, ROUTINE W REFLEX MICROSCOPIC
Bacteria, UA: NONE SEEN
Bilirubin Urine: NEGATIVE
Glucose, UA: NEGATIVE mg/dL
Hgb urine dipstick: NEGATIVE
Ketones, ur: NEGATIVE mg/dL
Nitrite: NEGATIVE
Protein, ur: NEGATIVE mg/dL
Specific Gravity, Urine: 1.028 (ref 1.005–1.030)
pH: 5 (ref 5.0–8.0)

## 2021-12-17 LAB — CBC
HCT: 39.9 % (ref 36.0–46.0)
Hemoglobin: 13.7 g/dL (ref 12.0–15.0)
MCH: 29.5 pg (ref 26.0–34.0)
MCHC: 34.3 g/dL (ref 30.0–36.0)
MCV: 85.8 fL (ref 80.0–100.0)
Platelets: 367 10*3/uL (ref 150–400)
RBC: 4.65 MIL/uL (ref 3.87–5.11)
RDW: 12.8 % (ref 11.5–15.5)
WBC: 13.9 10*3/uL — ABNORMAL HIGH (ref 4.0–10.5)
nRBC: 0 % (ref 0.0–0.2)

## 2021-12-17 LAB — LIPASE, BLOOD: Lipase: 34 U/L (ref 11–51)

## 2021-12-17 MED ORDER — ONDANSETRON HCL 4 MG/2ML IJ SOLN
4.0000 mg | Freq: Once | INTRAMUSCULAR | Status: AC
  Administered 2021-12-17: 4 mg via INTRAVENOUS
  Filled 2021-12-17: qty 2

## 2021-12-17 MED ORDER — DIPHENHYDRAMINE HCL 50 MG/ML IJ SOLN
12.5000 mg | Freq: Once | INTRAMUSCULAR | Status: AC
  Administered 2021-12-17: 12.5 mg via INTRAVENOUS
  Filled 2021-12-17: qty 1

## 2021-12-17 MED ORDER — MORPHINE SULFATE (PF) 4 MG/ML IV SOLN
4.0000 mg | Freq: Once | INTRAVENOUS | Status: AC
  Administered 2021-12-17: 4 mg via INTRAVENOUS
  Filled 2021-12-17: qty 1

## 2021-12-17 MED ORDER — ONDANSETRON HCL 4 MG PO TABS
4.0000 mg | ORAL_TABLET | Freq: Four times a day (QID) | ORAL | 0 refills | Status: AC | PRN
Start: 2021-12-17 — End: ?

## 2021-12-17 MED ORDER — SODIUM CHLORIDE 0.9 % IV BOLUS
1000.0000 mL | Freq: Once | INTRAVENOUS | Status: AC
  Administered 2021-12-17: 1000 mL via INTRAVENOUS

## 2021-12-17 MED ORDER — PROCHLORPERAZINE EDISYLATE 10 MG/2ML IJ SOLN
10.0000 mg | Freq: Once | INTRAMUSCULAR | Status: AC
  Administered 2021-12-17: 10 mg via INTRAVENOUS
  Filled 2021-12-17: qty 2

## 2021-12-17 NOTE — ED Triage Notes (Signed)
Pt arrived POV from home c/o N/V/D that started out of nowhere last night. Pt denies any pain or sick contacts.

## 2021-12-17 NOTE — Telephone Encounter (Signed)
PT calls today in regards to emerging symptoms. PT had visited Dr.Sagardia with a diagnosis of pneumonia and was informed of infection in her blood stream. Was told to get back up with [place] if anything new emerges. PT has been throwing up and dealing with diarrhea for the last two days. PT stated she would be going to the hospital today first but wanted to let Dr.Sagardia know what was going on. She will follow up and update [place] as she knows more.  CB:[phone]

## 2021-12-17 NOTE — ED Provider Notes (Signed)
Assumed care from Dr. Particia Nearing.  Pt has had no further n/v.  Will po challenge to ensure pt is stable for d/c home. Pt tolerated ginger ale and crackers.  Will d/c home with return precautions.   Gwyneth Sprout, MD 12/17/21 [phone]

## 2021-12-17 NOTE — Discharge Instructions (Addendum)
If you start having worsening abdominal pain, get a fever or can't hold anything down at home return to the ER.  All your labs were reassuring today.

## 2021-12-17 NOTE — ED Notes (Signed)
RN reviewed discharge instructions w/ pt. Follow up and prescriptions reviewed, pt had no further questions °

## 2021-12-17 NOTE — ED Provider Notes (Signed)
[facility] EMERGENCY DEPARTMENT Provider Note   CSN: [id number] Arrival date & time: 12/17/21  1104     History  Chief Complaint  Patient presents with   Emesis   Diarrhea    NICHOL ATOR is a 66 y.o. female.  Pt is a 66 yo female with a pmhx significant for htn, high cholesterol, seizures, copd and chronic pain.  Pt did have CAP with sepsis in March and was on several abx, but no abx recently.  Pt woke up early this morning with n/v/d.  Pt said it lasted all night.  She said she was vomiting at the same time as having diarrhea.  She has finally stopped that, but feels very nauseous.  She did call her pcp to let him know she was coming to the ED.  Pt had a possible fever this morning.  She thinks she was able to keep down tylenol.  She has not been able to take any of her other meds due to vomiting.      Home Medications Prior to Admission medications   Medication Sig Start Date End Date Taking? Authorizing Provider  albuterol (PROVENTIL) (2.5 MG/3ML) 0.083% nebulizer solution Take 3 mLs (2.5 mg total) by nebulization every 6 (six) hours as needed for wheezing or shortness of breath. 10/09/21   Curatolo, Adam, DO  albuterol (VENTOLIN HFA) 108 (90 Base) MCG/ACT inhaler Inhale 1-2 puffs into the lungs every 4 (four) hours as needed for shortness of breath or wheezing. 12/08/21   Georgina Quint, MD  APTIOM 400 MG TABS Take 1 tablet (400 mg total) by mouth daily. 06/16/21   Van Clines, MD  aspirin 81 MG EC tablet Take 81 mg by mouth daily. Swallow whole.    [provider]  diphenhydrAMINE (BENADRYL) 25 MG tablet Take 25 mg by mouth at bedtime as needed for allergies.    [provider]  hydrOXYzine (ATARAX) 25 MG tablet TAKE 1 TABLET BY MOUTH AT BEDTIME AS NEEDED. 10/21/21   Georgina Quint, MD  ibuprofen (ADVIL) 600 MG tablet Take 1 tablet (600 mg total) by mouth every 6 (six) hours as needed. 03/14/21   Sloan Leiter, DO   ipratropium (ATROVENT HFA) 17 MCG/ACT inhaler Inhale 2 puffs into the lungs every 6 (six) hours as needed for wheezing. 03/14/21   Sloan Leiter, DO  lacosamide (VIMPAT) 200 MG TABS tablet Take 1 tablet (200 mg total) by mouth 2 (two) times daily. Patient taking differently: Take 200 mg by mouth daily. 06/16/21   Van Clines, MD  losartan (COZAAR) 50 MG tablet Take 1 tablet (50 mg total) by mouth daily. 10/19/21   Georgina Quint, MD  Melatonin 5 MG CAPS Take 5 mg by mouth at bedtime as needed (sleep).    [provider]  methocarbamol (ROBAXIN) 500 MG tablet Take 1 tablet (500 mg total) by mouth 2 (two) times daily as needed for muscle spasms. 12/08/21   Georgina Quint, MD  montelukast (SINGULAIR) 5 MG chewable tablet Chew 10 mg by mouth at bedtime. 03/19/21   [provider]  omeprazole (PRILOSEC) 20 MG capsule Take 20 mg by mouth daily.    [provider]  polyethylene glycol (MIRALAX) 17 g packet Take 17 g by mouth daily. 10/12/21   Burnadette Pop, MD  potassium chloride SA (KLOR-CON M) 20 MEQ tablet Take 2 tablets (40 mEq total) by mouth daily for 3 days. 10/13/21 10/16/21  Burnadette Pop, MD  simvastatin (ZOCOR) 20 MG tablet Take 20 mg by mouth at bedtime. 05/08/21   [provider]  SYMBICORT 160-4.5 MCG/ACT inhaler Inhale 2 puffs into the lungs in the morning and at bedtime. 09/24/21   [provider]      Allergies    Beef-derived products, Fish allergy, and Milk-related compounds    Review of Systems   Review of Systems  Gastrointestinal:  Positive for diarrhea, nausea and vomiting.  All other systems reviewed and are negative.  Physical Exam Updated Vital Signs BP (!) 118/56 (BP Location: Left Arm)   Pulse 83   Temp 98.7 F (37.1 C) (Oral)   Resp 17   Ht 5' (1.524 m)   Wt 65.8 kg   SpO2 98%   BMI 28.32 kg/m  Physical Exam Vitals and nursing note reviewed.  Constitutional:      Appearance: Normal appearance.   HENT:     Head: Normocephalic and atraumatic.     Right Ear: External ear normal.     Left Ear: External ear normal.     Nose: Nose normal.     Mouth/Throat:     Mouth: Mucous membranes are dry.  Eyes:     Extraocular Movements: Extraocular movements intact.     Conjunctiva/sclera: Conjunctivae normal.     Pupils: Pupils are equal, round, and reactive to light.  Cardiovascular:     Rate and Rhythm: Normal rate and regular rhythm.     Pulses: Normal pulses.     Heart sounds: Normal heart sounds.  Pulmonary:     Effort: Pulmonary effort is normal.     Breath sounds: Normal breath sounds.  Abdominal:     General: Abdomen is flat. Bowel sounds are normal.     Palpations: Abdomen is soft.  Musculoskeletal:        General: Normal range of motion.     Cervical back: Normal range of motion and neck supple.  Skin:    General: Skin is warm.     Capillary Refill: Capillary refill takes less than 2 seconds.  Neurological:     General: No focal deficit present.     Mental Status: She is alert and oriented to person, place, and time.  Psychiatric:        Mood and Affect: Mood normal.        Behavior: Behavior normal.    ED Results / Procedures / Treatments   Labs (all labs ordered are listed, but only abnormal results are displayed) Labs Reviewed  COMPREHENSIVE METABOLIC PANEL - Abnormal; Notable for the following components:      Result Value   Glucose, Bld 128 (*)    All other components within normal limits  CBC - Abnormal; Notable for the following components:   WBC 13.9 (*)    All other components within normal limits  URINALYSIS, ROUTINE W REFLEX MICROSCOPIC - Abnormal; Notable for the following components:   APPearance HAZY (*)    Leukocytes,Ua TRACE (*)    All other components within normal limits  LIPASE, BLOOD    EKG None  Radiology No results found.  Procedures Procedures    Medications Ordered in ED Medications  morphine (PF) 4 MG/ML injection 4 mg  (has no administration in time range)  prochlorperazine (COMPAZINE) injection 10 mg (has no administration in time range)  diphenhydrAMINE (BENADRYL) injection 12.5 mg (has no administration in time range)  sodium chloride 0.9 % bolus 1,000 mL (1,000 mLs Intravenous New Bag/Given 12/17/21 1415)  ondansetron (ZOFRAN)  injection 4 mg (4 mg Intravenous Given 12/17/21 1415)    ED Course/ Medical Decision Making/ A&P                           Medical Decision Making Amount and/or Complexity of Data Reviewed Labs: ordered.  Risk Prescription drug management.   This patient presents to the ED for concern of n/v/d, this involves an extensive number of treatment options, and is a complaint that carries with it a high risk of complications and morbidity.  The differential diagnosis includes gastroenteritis, food poisoning, infection, electrolyte abn   Co morbidities that complicate the patient evaluation  htn, high cholesterol, seizures, copd and chronic pain   Additional history obtained:  Additional history obtained from epic chart review   Lab Tests:  I Ordered, and personally interpreted labs.  The pertinent results include:  ua nl, cmp nl, cbc with slight elevation 13.9   Cardiac Monitoring:  The patient was maintained on a cardiac monitor.  I personally viewed and interpreted the cardiac monitored which showed an underlying rhythm of: nsr   Medicines ordered and prescription drug management:  I ordered medication including IVFs and IV zofran  for dehydration and n/v  Reevaluation of the patient after these medicines showed that the patient improved I have reviewed the patients home medicines and have made adjustments as needed  Critical Interventions:  IVFs  Problem List / ED Course:  N/V/D:  pt is still nauseous after zofran.  She is also having some stomach cramps.  She is given compazine and benadryl and some morphine.  Pt signed out to Dr. Anitra LauthPlunkett.  I anticipate d/c  once she is feeling better.   Reevaluation:  After the interventions noted above, I reevaluated the patient and found that they have :improved   Social Determinants of Health:  Lives at home   Dispostion:  Dispo pending at shift change.        Final Clinical Impression(s) / ED Diagnoses Final diagnoses:  Dehydration  Nausea vomiting and diarrhea    Rx / DC Orders ED Discharge Orders     None         Jacalyn LefevreHaviland, Stiven Kaspar, MD 12/17/21 1451

## 2021-12-17 NOTE — Telephone Encounter (Signed)
Thank you :)

## 2021-12-22 ENCOUNTER — Encounter: Payer: Self-pay | Admitting: Neurology

## 2021-12-22 ENCOUNTER — Ambulatory Visit (INDEPENDENT_AMBULATORY_CARE_PROVIDER_SITE_OTHER): Payer: Medicare Other | Admitting: Neurology

## 2021-12-22 VITALS — BP 117/65 | HR 66 | Ht 60.0 in | Wt 142.0 lb

## 2021-12-22 DIAGNOSIS — G40009 Localization-related (focal) (partial) idiopathic epilepsy and epileptic syndromes with seizures of localized onset, not intractable, without status epilepticus: Secondary | ICD-10-CM

## 2021-12-22 MED ORDER — LACOSAMIDE 200 MG PO TABS: 200.0000 mg | ORAL_TABLET | Freq: Two times a day (BID) | ORAL | 3 refills | Status: DC

## 2021-12-22 MED ORDER — APTIOM 400 MG PO TABS
1.0000 | ORAL_TABLET | Freq: Every day | ORAL | 3 refills | Status: DC
Start: 2021-12-22 — End: 2022-07-16
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
## 2021-12-28 ENCOUNTER — Ambulatory Visit (INDEPENDENT_AMBULATORY_CARE_PROVIDER_SITE_OTHER): Payer: Medicare Other | Admitting: Emergency Medicine

## 2021-12-28 ENCOUNTER — Encounter: Payer: Self-pay | Admitting: Emergency Medicine

## 2021-12-28 VITALS — BP 100/66 | HR 72 | Temp 98.6°F | Ht 60.0 in | Wt 142.2 lb

## 2021-12-28 DIAGNOSIS — M545 Low back pain, unspecified: Secondary | ICD-10-CM

## 2021-12-28 DIAGNOSIS — G894 Chronic pain syndrome: Secondary | ICD-10-CM | POA: Diagnosis not present

## 2021-12-28 DIAGNOSIS — Z8669 Personal history of other diseases of the nervous system and sense organs: Secondary | ICD-10-CM | POA: Diagnosis not present

## 2021-12-28 DIAGNOSIS — E7800 Pure hypercholesterolemia, unspecified: Secondary | ICD-10-CM

## 2021-12-28 DIAGNOSIS — G8929 Other chronic pain: Secondary | ICD-10-CM

## 2021-12-28 DIAGNOSIS — Z1211 Encounter for screening for malignant neoplasm of colon: Secondary | ICD-10-CM

## 2021-12-28 DIAGNOSIS — I10 Essential (primary) hypertension: Secondary | ICD-10-CM

## 2021-12-28 DIAGNOSIS — J449 Chronic obstructive pulmonary disease, unspecified: Secondary | ICD-10-CM | POA: Diagnosis not present

## 2021-12-28 DIAGNOSIS — Z1231 Encounter for screening mammogram for malignant neoplasm of breast: Secondary | ICD-10-CM

## 2021-12-28 NOTE — Assessment & Plan Note (Signed)
Stable. Continue simvastatin 20mg daily

## 2021-12-28 NOTE — Assessment & Plan Note (Signed)
Stable.  Continue daily Symbicort and Atrovent and albuterol as needed.

## 2021-12-28 NOTE — Assessment & Plan Note (Signed)
Recently evaluated by neurologist.  Continue seizure medications as managed by neurologist.

## 2021-12-28 NOTE — Patient Instructions (Signed)

## 2021-12-28 NOTE — Assessment & Plan Note (Signed)
Well-controlled hypertension. BP Readings from Last 3 Encounters:  12/28/21 100/66  12/22/21 117/65  12/17/21 (!) 110/57  Continue losartan 50 mg daily.

## 2021-12-28 NOTE — Assessment & Plan Note (Signed)
Stable and well controlled. 

## 2021-12-28 NOTE — Progress Notes (Signed)
Angela Giles 66 y.o.   Chief Complaint  Patient presents with   Follow-up    Seen in emergency room for N/V and Diarrhea     HISTORY OF PRESENT ILLNESS: This is a 66 y.o. female here for follow-up of emergency department visit on 12/17/2021 when she presented with nausea vomiting and diarrhea.  Diagnosed with dehydration. Blood work results reviewed with patient.  Unremarkable labs with mild leukocytosis. Doing well.  Recovered 100%. No complaints or medical concerns today.  HPI   Prior to Admission medications   Medication Sig Start Date End Date Taking? Authorizing Provider  albuterol (PROVENTIL) (2.5 MG/3ML) 0.083% nebulizer solution Take 3 mLs (2.5 mg total) by nebulization every 6 (six) hours as needed for wheezing or shortness of breath. 10/09/21  Yes Curatolo, Adam, DO  albuterol (VENTOLIN HFA) 108 (90 Base) MCG/ACT inhaler Inhale 1-2 puffs into the lungs every 4 (four) hours as needed for shortness of breath or wheezing. 12/08/21  Yes Eddy Termine, Eilleen KempfMiguel Jose, MD  APTIOM 400 MG TABS Take 1 tablet (400 mg total) by mouth daily. 12/22/21  Yes Van ClinesAquino, Karen M, MD  aspirin 81 MG EC tablet Take 81 mg by mouth daily. Swallow whole.   Yes [provider]  diphenhydrAMINE (BENADRYL) 25 MG tablet Take 25 mg by mouth at bedtime as needed for allergies.   Yes [provider]  hydrOXYzine (ATARAX) 25 MG tablet TAKE 1 TABLET BY MOUTH AT BEDTIME AS NEEDED. 10/21/21  Yes Nycole Kawahara, Eilleen KempfMiguel Jose, MD  ipratropium (ATROVENT HFA) 17 MCG/ACT inhaler Inhale 2 puffs into the lungs every 6 (six) hours as needed for wheezing. 03/14/21  Yes Tanda RockersGray, Samuel A, DO  lacosamide (VIMPAT) 200 MG TABS tablet Take 1 tablet (200 mg total) by mouth 2 (two) times daily. 12/22/21  Yes Van ClinesAquino, Karen M, MD  losartan (COZAAR) 50 MG tablet Take 1 tablet (50 mg total) by mouth daily. 10/19/21  Yes Saige Canton, Eilleen KempfMiguel Jose, MD  Melatonin 5 MG CAPS Take 5 mg by mouth at bedtime as needed (sleep).   Yes [provider]  methocarbamol (ROBAXIN) 500 MG tablet Take 1 tablet (500 mg total) by mouth 2 (two) times daily as needed for muscle spasms. 12/08/21  Yes Montravious Weigelt, Eilleen KempfMiguel Jose, MD  montelukast (SINGULAIR) 5 MG chewable tablet Chew 10 mg by mouth at bedtime. 03/19/21  Yes [provider]  omeprazole (PRILOSEC) 20 MG capsule Take 20 mg by mouth daily.   Yes [provider]  ondansetron (ZOFRAN) 4 MG tablet Take 1 tablet (4 mg total) by mouth every 6 (six) hours as needed for nausea or vomiting. 12/17/21  Yes Plunkett, Alphonzo LemmingsWhitney, MD  polyethylene glycol (MIRALAX) 17 g packet Take 17 g by mouth daily. 10/12/21  Yes Burnadette PopAdhikari, Amrit, MD  simvastatin (ZOCOR) 20 MG tablet Take 20 mg by mouth at bedtime. 05/08/21  Yes [provider]  SYMBICORT 160-4.5 MCG/ACT inhaler Inhale 2 puffs into the lungs in the morning and at bedtime. 09/24/21  Yes [provider]  ibuprofen (ADVIL) 600 MG tablet Take 1 tablet (600 mg total) by mouth every 6 (six) hours as needed. Patient not taking: Reported on 12/22/2021 03/14/21   Tanda RockersGray, Samuel A, DO  potassium chloride SA (KLOR-CON M) 20 MEQ tablet Take 2 tablets (40 mEq total) by mouth daily for 3 days. 10/13/21 12/22/21  Burnadette PopAdhikari, Amrit, MD    Allergies  Allergen Reactions   Beef-Derived Products    Fish Allergy    Milk-Related Compounds  Patient Active Problem List   Diagnosis Date Noted   SIADH (syndrome of inappropriate ADH production) (HCC) 10/19/2021   COPD with acute exacerbation (HCC) 10/10/2021   High cholesterol    GERD (gastroesophageal reflux disease)    Chronic pain syndrome 09/28/2021   Essential hypertension 09/28/2021   Chronic obstructive pulmonary disease (HCC) 09/28/2021   History of seizure disorder 09/28/2021   Insomnia 09/28/2021   Chronic bilateral low back pain without sciatica 09/28/2021    Past Medical History:  Diagnosis Date   Chronic back pain    COPD (chronic obstructive pulmonary disease) (HCC)    Dorsalgia     High cholesterol    Hypertension    Seizures (HCC)     Past Surgical History:  Procedure Laterality Date   ABDOMINAL HYSTERECTOMY     CHOLECYSTECTOMY      Social History   Socioeconomic History   Marital status: Divorced    Spouse name: Not on file   Number of children: 3   Years of education: Not on file   Highest education level: Not on file  Occupational History   Not on file  Tobacco Use   Smoking status: Former   Smokeless tobacco: Never  Vaping Use   Vaping Use: Never used  Substance and Sexual Activity   Alcohol use: Yes    Comment: occasionally   Drug use: Never   Sexual activity: Not on file  Other Topics Concern   Not on file  Social History Narrative   Lives in a one story home with a basement    Right handed    One soda of caffeine daily   Social Determinants of Health   Financial Resource Strain: Not on file  Food Insecurity: Not on file  Transportation Needs: Not on file  Physical Activity: Not on file  Stress: Not on file  Social Connections: Not on file  Intimate Partner Violence: Not on file    Family History  Problem Relation Age of Onset   Dementia Mother    Heart attack Father    Diabetes Father    Lupus Sister      Review of Systems  Constitutional: Negative.  Negative for chills and fever.  HENT: Negative.  Negative for congestion and sore throat.   Respiratory: Negative.  Negative for cough and shortness of breath.   Cardiovascular: Negative.  Negative for chest pain and palpitations.  Gastrointestinal: Negative.  Negative for abdominal pain, diarrhea, nausea and vomiting.  Genitourinary: Negative.   Musculoskeletal: Negative.   Skin: Negative.  Negative for rash.  Neurological: Negative.  Negative for dizziness and headaches.  All other systems reviewed and are negative.  Today's Vitals   12/28/21 1407  BP: 100/66  Pulse: 72  Temp: 98.6 F (37 C)  TempSrc: Oral  SpO2: 98%  Weight: 142 lb 4 oz (64.5 kg)  Height: 5'  (1.524 m)   Body mass index is 27.78 kg/m.   Physical Exam Vitals reviewed.  Constitutional:      Appearance: Normal appearance.  HENT:     Head: Normocephalic.     Mouth/Throat:     Mouth: Mucous membranes are moist.     Pharynx: Oropharynx is clear.  Eyes:     Extraocular Movements: Extraocular movements intact.     Conjunctiva/sclera: Conjunctivae normal.     Pupils: Pupils are equal, round, and reactive to light.  Cardiovascular:     Rate and Rhythm: Normal rate and regular rhythm.     Pulses: Normal  pulses.     Heart sounds: Normal heart sounds.  Pulmonary:     Effort: Pulmonary effort is normal.     Breath sounds: Normal breath sounds.  Abdominal:     Palpations: Abdomen is soft.     Tenderness: There is no abdominal tenderness.  Musculoskeletal:        General: Normal range of motion.     Cervical back: No tenderness.  Lymphadenopathy:     Cervical: No cervical adenopathy.  Skin:    General: Skin is warm and dry.     Capillary Refill: Capillary refill takes less than 2 seconds.  Neurological:     General: No focal deficit present.     Mental Status: She is alert and oriented to person, place, and time.  Psychiatric:        Mood and Affect: Mood normal.        Behavior: Behavior normal.      ASSESSMENT & PLAN: Problem List Items Addressed This Visit       Cardiovascular and Mediastinum   Essential hypertension - Primary    Well-controlled hypertension. BP Readings from Last 3 Encounters:  12/28/21 100/66  12/22/21 117/65  12/17/21 (!) 110/57  Continue losartan 50 mg daily.         Respiratory   Chronic obstructive pulmonary disease (HCC)    Stable.  Continue daily Symbicort and Atrovent and albuterol as needed.        Other   Chronic pain syndrome    Stable and well-controlled.      History of seizure disorder    Recently evaluated by neurologist.  Continue seizure medications as managed by neurologist.      Chronic bilateral low  back pain without sciatica   High cholesterol    Stable.  Continue simvastatin 20 mg daily.       Other Visit Diagnoses     Breast cancer screening by mammogram       Relevant Orders   MM Digital Screening   Colon cancer screening       Relevant Orders   Ambulatory referral to Gastroenterology      Patient Instructions  Health Maintenance, Female Adopting a healthy lifestyle and getting preventive care are important in promoting health and wellness. Ask your health care provider about: The right schedule for you to have regular tests and exams. Things you can do on your own to prevent diseases and keep yourself healthy. What should I know about diet, weight, and exercise? Eat a healthy diet  Eat a diet that includes plenty of vegetables, fruits, low-fat dairy products, and lean protein. Do not eat a lot of foods that are high in solid fats, added sugars, or sodium. Maintain a healthy weight Body mass index (BMI) is used to identify weight problems. It estimates body fat based on height and weight. Your health care provider can help determine your BMI and help you achieve or maintain a healthy weight. Get regular exercise Get regular exercise. This is one of the most important things you can do for your health. Most adults should: Exercise for at least 150 minutes each week. The exercise should increase your heart rate and make you sweat (moderate-intensity exercise). Do strengthening exercises at least twice a week. This is in addition to the moderate-intensity exercise. Spend less time sitting. Even light physical activity can be beneficial. Watch cholesterol and blood lipids Have your blood tested for lipids and cholesterol at 64 years of age, then have this test  every 5 years. Have your cholesterol levels checked more often if: Your lipid or cholesterol levels are high. You are older than 64 years of age. You are at high risk for heart disease. What should I know about  cancer screening? Depending on your health history and family history, you may need to have cancer screening at various ages. This may include screening for: Breast cancer. Cervical cancer. Colorectal cancer. Skin cancer. Lung cancer. What should I know about heart disease, diabetes, and high blood pressure? Blood pressure and heart disease High blood pressure causes heart disease and increases the risk of stroke. This is more likely to develop in people who have high blood pressure readings or are overweight. Have your blood pressure checked: Every 3-5 years if you are 10-62 years of age. Every year if you are 72 years old or older. Diabetes Have regular diabetes screenings. This checks your fasting blood sugar level. Have the screening done: Once every three years after age 28 if you are at a normal weight and have a low risk for diabetes. More often and at a younger age if you are overweight or have a high risk for diabetes. What should I know about preventing infection? Hepatitis B If you have a higher risk for hepatitis B, you should be screened for this virus. Talk with your health care provider to find out if you are at risk for hepatitis B infection. Hepatitis C Testing is recommended for: Everyone born from [birth year] through 1963. Anyone with known risk factors for hepatitis C. Sexually transmitted infections (STIs) Get screened for STIs, including gonorrhea and chlamydia, if: You are sexually active and are younger than 64 years of age. You are older than 64 years of age and your health care provider tells you that you are at risk for this type of infection. Your sexual activity has changed since you were last screened, and you are at increased risk for chlamydia or gonorrhea. Ask your health care provider if you are at risk. Ask your health care provider about whether you are at high risk for HIV. Your health care provider may recommend a prescription medicine to help prevent HIV  infection. If you choose to take medicine to prevent HIV, you should first get tested for HIV. You should then be tested every 3 months for as long as you are taking the medicine. Pregnancy If you are about to stop having your period (premenopausal) and you may become pregnant, seek counseling before you get pregnant. Take 400 to 800 micrograms (mcg) of folic acid every day if you become pregnant. Ask for birth control (contraception) if you want to prevent pregnancy. Osteoporosis and menopause Osteoporosis is a disease in which the bones lose minerals and strength with aging. This can result in bone fractures. If you are 80 years old or older, or if you are at risk for osteoporosis and fractures, ask your health care provider if you should: Be screened for bone loss. Take a calcium or vitamin D supplement to lower your risk of fractures. Be given hormone replacement therapy (HRT) to treat symptoms of menopause. Follow these instructions at home: Alcohol use Do not drink alcohol if: Your health care provider tells you not to drink. You are pregnant, may be pregnant, or are planning to become pregnant. If you drink alcohol: Limit how much you have to: 0-1 drink a day. Know how much alcohol is in your drink. In the [place] one drink equals one 12 oz bottle of beer (355  mL), one 5 oz glass of wine (148 mL), or one 1 oz glass of hard liquor (44 mL). Lifestyle Do not use any products that contain nicotine or tobacco. These products include cigarettes, chewing tobacco, and vaping devices, such as e-cigarettes. If you need help quitting, ask your health care provider. Do not use street drugs. Do not share needles. Ask your health care provider for help if you need support or information about quitting drugs. General instructions Schedule regular health, dental, and eye exams. Stay current with your vaccines. Tell your health care provider if: You often feel depressed. You have ever been abused  or do not feel safe at home. Summary Adopting a healthy lifestyle and getting preventive care are important in promoting health and wellness. Follow your health care provider's instructions about healthy diet, exercising, and getting tested or screened for diseases. Follow your health care provider's instructions on monitoring your cholesterol and blood pressure. This information is not intended to replace advice given to you by your health care provider. Make sure you discuss any questions you have with your health care provider. Document Revised: 11/24/2020 Document Reviewed: 11/24/2020 Elsevier Patient Education  2023 Elsevier Inc.     Edwina Barth, MD [facility]

## 2022-01-05 ENCOUNTER — Ambulatory Visit: Payer: Medicaid - Out of State | Admitting: Emergency Medicine

## 2022-02-01 ENCOUNTER — Telehealth: Payer: Self-pay | Admitting: Emergency Medicine

## 2022-02-01 MED ORDER — SIMVASTATIN 20 MG PO TABS: 20.0000 mg | ORAL_TABLET | Freq: Every day | ORAL | 1 refills | Status: DC

## 2022-02-01 NOTE — Telephone Encounter (Signed)
Caller & Relationship to patient: Angela Giles  Call back number: [phone]  Date of last office visit: 12/28/21  Date of next office visit: 04/08/22  Medication(s) to be refilled:  simvastatin (ZOCOR) 20 MG tablet  Preferred Pharmacy:  [facility] [address] [place]), [place] - [address] Phone:  [phone]  Fax:  [phone]

## 2022-02-01 NOTE — Telephone Encounter (Signed)
New prescription sent to requested pharmacy  

## 2022-02-02 ENCOUNTER — Telehealth: Payer: Self-pay | Admitting: Emergency Medicine

## 2022-02-02 DIAGNOSIS — G4700 Insomnia, unspecified: Secondary | ICD-10-CM

## 2022-02-02 MED ORDER — HYDROXYZINE HCL 25 MG PO TABS: 25.0000 mg | ORAL_TABLET | Freq: Every evening | ORAL | 0 refills | Status: DC | PRN

## 2022-02-02 NOTE — Telephone Encounter (Signed)
Caller & Relationship to patient: Angela Giles  Call back number: [phone]  Date of last office visit: 12/28/21  Date of next office visit: 04/08/22  Medication(s) to be refilled:  hydrOXYzine (ATARAX) 25 MG tablet   Preferred Pharmacy:  [facility] [address] [place]), [place] - [address] Phone:  [phone]  Fax:  [phone]

## 2022-02-04 ENCOUNTER — Other Ambulatory Visit: Payer: Self-pay | Admitting: Emergency Medicine

## 2022-02-04 ENCOUNTER — Ambulatory Visit
Admission: RE | Admit: 2022-02-04 | Discharge: 2022-02-04 | Disposition: A | Discharge status: Home / Self Care | Payer: Medicare Other | Source: Ambulatory Visit | Attending: Emergency Medicine | Admitting: Emergency Medicine

## 2022-02-04 DIAGNOSIS — Z1231 Encounter for screening mammogram for malignant neoplasm of breast: Secondary | ICD-10-CM

## 2022-02-07 ENCOUNTER — Encounter (HOSPITAL_COMMUNITY): Payer: Self-pay

## 2022-02-07 ENCOUNTER — Emergency Department (HOSPITAL_COMMUNITY): Payer: Medicare Other | Admitting: Anesthesiology

## 2022-02-07 ENCOUNTER — Emergency Department (EMERGENCY_DEPARTMENT_HOSPITAL): Payer: Medicare Other | Admitting: Anesthesiology

## 2022-02-07 ENCOUNTER — Other Ambulatory Visit: Payer: Self-pay

## 2022-02-07 ENCOUNTER — Inpatient Hospital Stay (HOSPITAL_COMMUNITY)
Admission: EM | Admit: 2022-02-07 | Discharge: 2022-02-10 | DRG: 342 | Disposition: A | Discharge status: Home / Self Care | Payer: Medicare Other | Attending: General Surgery | Admitting: General Surgery

## 2022-02-07 ENCOUNTER — Emergency Department (HOSPITAL_COMMUNITY): Payer: Medicare Other

## 2022-02-07 ENCOUNTER — Encounter (HOSPITAL_COMMUNITY): Admission: EM | Disposition: A | Discharge status: Home / Self Care | Payer: Self-pay | Source: Home / Self Care

## 2022-02-07 DIAGNOSIS — K3531 Acute appendicitis with localized peritonitis and gangrene, without perforation: Principal | ICD-10-CM | POA: Diagnosis present

## 2022-02-07 DIAGNOSIS — K3580 Unspecified acute appendicitis: Secondary | ICD-10-CM

## 2022-02-07 DIAGNOSIS — J449 Chronic obstructive pulmonary disease, unspecified: Secondary | ICD-10-CM | POA: Diagnosis not present

## 2022-02-07 DIAGNOSIS — I10 Essential (primary) hypertension: Secondary | ICD-10-CM | POA: Diagnosis present

## 2022-02-07 DIAGNOSIS — Z833 Family history of diabetes mellitus: Secondary | ICD-10-CM

## 2022-02-07 DIAGNOSIS — Z87891 Personal history of nicotine dependence: Secondary | ICD-10-CM

## 2022-02-07 DIAGNOSIS — E7800 Pure hypercholesterolemia, unspecified: Secondary | ICD-10-CM | POA: Diagnosis present

## 2022-02-07 DIAGNOSIS — Z91014 Allergy to mammalian meats: Secondary | ICD-10-CM

## 2022-02-07 DIAGNOSIS — Z91013 Allergy to seafood: Secondary | ICD-10-CM

## 2022-02-07 DIAGNOSIS — G8929 Other chronic pain: Secondary | ICD-10-CM | POA: Diagnosis present

## 2022-02-07 DIAGNOSIS — K353 Acute appendicitis with localized peritonitis, without perforation or gangrene: Principal | ICD-10-CM

## 2022-02-07 DIAGNOSIS — E871 Hypo-osmolality and hyponatremia: Secondary | ICD-10-CM | POA: Diagnosis not present

## 2022-02-07 DIAGNOSIS — Z8249 Family history of ischemic heart disease and other diseases of the circulatory system: Secondary | ICD-10-CM

## 2022-02-07 DIAGNOSIS — G40909 Epilepsy, unspecified, not intractable, without status epilepticus: Secondary | ICD-10-CM | POA: Diagnosis present

## 2022-02-07 DIAGNOSIS — Z91011 Allergy to milk products: Secondary | ICD-10-CM

## 2022-02-07 DIAGNOSIS — M549 Dorsalgia, unspecified: Secondary | ICD-10-CM | POA: Diagnosis present

## 2022-02-07 HISTORY — PX: LAPAROSCOPIC APPENDECTOMY: SHX408

## 2022-02-07 LAB — CBC WITH DIFFERENTIAL/PLATELET
Abs Immature Granulocytes: 0.06 10*3/uL (ref 0.00–0.07)
Basophils Absolute: 0 10*3/uL (ref 0.0–0.1)
Basophils Relative: 0 %
Eosinophils Absolute: 0.1 10*3/uL (ref 0.0–0.5)
Eosinophils Relative: 1 %
HCT: 39.4 % (ref 36.0–46.0)
Hemoglobin: 13.8 g/dL (ref 12.0–15.0)
Immature Granulocytes: 0 %
Lymphocytes Relative: 14 %
Lymphs Abs: 2 10*3/uL (ref 0.7–4.0)
MCH: 29.1 pg (ref 26.0–34.0)
MCHC: 35 g/dL (ref 30.0–36.0)
MCV: 83.1 fL (ref 80.0–100.0)
Monocytes Absolute: 0.9 10*3/uL (ref 0.1–1.0)
Monocytes Relative: 6 %
Neutro Abs: 11.5 10*3/uL — ABNORMAL HIGH (ref 1.7–7.7)
Neutrophils Relative %: 79 %
Platelets: 368 10*3/uL (ref 150–400)
RBC: 4.74 MIL/uL (ref 3.87–5.11)
RDW: 12 % (ref 11.5–15.5)
WBC: 14.6 10*3/uL — ABNORMAL HIGH (ref 4.0–10.5)
nRBC: 0 % (ref 0.0–0.2)

## 2022-02-07 LAB — COMPREHENSIVE METABOLIC PANEL
ALT: 10 U/L (ref 0–44)
AST: 17 U/L (ref 15–41)
Albumin: 4.2 g/dL (ref 3.5–5.0)
Alkaline Phosphatase: 91 U/L (ref 38–126)
Anion gap: 8 (ref 5–15)
BUN: 8 mg/dL (ref 8–23)
CO2: 27 mmol/L (ref 22–32)
Calcium: 9.6 mg/dL (ref 8.9–10.3)
Chloride: 99 mmol/L (ref 98–111)
Creatinine, Ser: 0.86 mg/dL (ref 0.44–1.00)
GFR, Estimated: >60 mL/min (ref >60)
Glucose, Bld: 128 mg/dL — ABNORMAL HIGH (ref 70–99)
Potassium: 4.2 mmol/L (ref 3.5–5.1)
Sodium: 134 mmol/L — ABNORMAL LOW (ref 135–145)
Total Bilirubin: 0.6 mg/dL (ref 0.3–1.2)
Total Protein: 7.4 g/dL (ref 6.5–8.1)

## 2022-02-07 LAB — URINALYSIS, ROUTINE W REFLEX MICROSCOPIC
Bilirubin Urine: NEGATIVE
Glucose, UA: NEGATIVE mg/dL
Hgb urine dipstick: NEGATIVE
Ketones, ur: NEGATIVE mg/dL
Leukocytes,Ua: NEGATIVE
Nitrite: NEGATIVE
Protein, ur: NEGATIVE mg/dL
Specific Gravity, Urine: 1.015 (ref 1.005–1.030)
pH: 7 (ref 5.0–8.0)

## 2022-02-07 LAB — LIPASE, BLOOD: Lipase: 30 U/L (ref 11–51)

## 2022-02-07 SURGERY — APPENDECTOMY, LAPAROSCOPIC
Anesthesia: General | Site: Abdomen

## 2022-02-07 MED ORDER — ORAL CARE MOUTH RINSE
15.0000 mL | Freq: Once | OROMUCOSAL | Status: AC
Start: 2022-02-07 — End: 2022-02-07

## 2022-02-07 MED ORDER — CELECOXIB 200 MG PO CAPS
ORAL_CAPSULE | ORAL | Status: AC
  Administered 2022-02-07: 200 mg via ORAL
  Filled 2022-02-07: qty 1

## 2022-02-07 MED ORDER — OXYCODONE HCL 5 MG/5ML PO SOLN: 5.0000 mg | Freq: Once | ORAL | Status: DC | PRN

## 2022-02-07 MED ORDER — FENTANYL CITRATE PF 50 MCG/ML IJ SOSY
50.0000 ug | PREFILLED_SYRINGE | Freq: Once | INTRAMUSCULAR | Status: AC
  Administered 2022-02-07: 50 ug via INTRAVENOUS
  Filled 2022-02-07: qty 1

## 2022-02-07 MED ORDER — ROCURONIUM BROMIDE 10 MG/ML (PF) SYRINGE
PREFILLED_SYRINGE | INTRAVENOUS | Status: AC
  Filled 2022-02-07: qty 10

## 2022-02-07 MED ORDER — ONDANSETRON HCL 4 MG/2ML IJ SOLN
INTRAMUSCULAR | Status: AC
  Filled 2022-02-07: qty 2

## 2022-02-07 MED ORDER — DEXAMETHASONE SODIUM PHOSPHATE 10 MG/ML IJ SOLN
INTRAMUSCULAR | Status: DC | PRN
  Administered 2022-02-07: 10 mg via INTRAVENOUS

## 2022-02-07 MED ORDER — SUCCINYLCHOLINE CHLORIDE 200 MG/10ML IV SOSY
PREFILLED_SYRINGE | INTRAVENOUS | Status: DC | PRN
  Administered 2022-02-07: 100 mg via INTRAVENOUS

## 2022-02-07 MED ORDER — PHENYLEPHRINE 80 MCG/ML (10ML) SYRINGE FOR IV PUSH (FOR BLOOD PRESSURE SUPPORT)
PREFILLED_SYRINGE | INTRAVENOUS | Status: AC
  Filled 2022-02-07: qty 10

## 2022-02-07 MED ORDER — FENTANYL CITRATE (PF) 250 MCG/5ML IJ SOLN
INTRAMUSCULAR | Status: AC
  Filled 2022-02-07: qty 5

## 2022-02-07 MED ORDER — PHENYLEPHRINE 80 MCG/ML (10ML) SYRINGE FOR IV PUSH (FOR BLOOD PRESSURE SUPPORT)
PREFILLED_SYRINGE | INTRAVENOUS | Status: DC | PRN
  Administered 2022-02-07 (×3): 160 ug via INTRAVENOUS

## 2022-02-07 MED ORDER — DOCUSATE SODIUM 100 MG PO CAPS
100.0000 mg | ORAL_CAPSULE | Freq: Two times a day (BID) | ORAL | Status: DC
  Administered 2022-02-07 – 2022-02-08 (×2): 100 mg via ORAL
  Filled 2022-02-07 (×2): qty 1

## 2022-02-07 MED ORDER — EPHEDRINE SULFATE-NACL 50-0.9 MG/10ML-% IV SOSY
PREFILLED_SYRINGE | INTRAVENOUS | Status: DC | PRN
  Administered 2022-02-07 (×3): 10 mg via INTRAVENOUS

## 2022-02-07 MED ORDER — CEFTRIAXONE SODIUM 2 G IJ SOLR
2.0000 g | Freq: Once | INTRAMUSCULAR | Status: AC
  Administered 2022-02-07: 2 g via INTRAVENOUS
  Filled 2022-02-07: qty 20

## 2022-02-07 MED ORDER — ENOXAPARIN SODIUM 40 MG/0.4ML IJ SOSY
40.0000 mg | PREFILLED_SYRINGE | INTRAMUSCULAR | Status: DC
Start: 2022-02-08 — End: 2022-02-08

## 2022-02-07 MED ORDER — DOCUSATE SODIUM 100 MG PO CAPS
100.0000 mg | ORAL_CAPSULE | Freq: Two times a day (BID) | ORAL | 2 refills | Status: AC
  Filled 2022-02-07: qty 60, 30d supply, fill #0

## 2022-02-07 MED ORDER — ACETAMINOPHEN 500 MG PO TABS
ORAL_TABLET | ORAL | Status: AC
  Administered 2022-02-07: 1000 mg via ORAL
  Filled 2022-02-07: qty 2

## 2022-02-07 MED ORDER — METHOCARBAMOL 500 MG PO TABS
1000.0000 mg | ORAL_TABLET | Freq: Three times a day (TID) | ORAL | Status: DC
  Administered 2022-02-07 – 2022-02-08 (×2): 1000 mg via ORAL
  Filled 2022-02-07 (×2): qty 2

## 2022-02-07 MED ORDER — METRONIDAZOLE 500 MG/100ML IV SOLN
500.0000 mg | Freq: Once | INTRAVENOUS | Status: AC
Start: 2022-02-07 — End: 2022-02-07
  Administered 2022-02-07: 500 mg via INTRAVENOUS
  Filled 2022-02-07: qty 100

## 2022-02-07 MED ORDER — CELECOXIB 200 MG PO CAPS: 200.0000 mg | ORAL_CAPSULE | Freq: Once | ORAL | Status: AC

## 2022-02-07 MED ORDER — OXYCODONE HCL 5 MG/5ML PO SOLN
5.0000 mg | ORAL | Status: DC | PRN
  Administered 2022-02-08 (×2): 5 mg via ORAL
  Filled 2022-02-07 (×2): qty 5

## 2022-02-07 MED ORDER — MIDAZOLAM HCL 2 MG/2ML IJ SOLN
INTRAMUSCULAR | Status: AC
  Filled 2022-02-07: qty 2

## 2022-02-07 MED ORDER — IBUPROFEN 600 MG PO TABS
600.0000 mg | ORAL_TABLET | Freq: Four times a day (QID) | ORAL | 1 refills | Status: AC
  Filled 2022-02-07: qty 120, 30d supply, fill #0

## 2022-02-07 MED ORDER — CHLORHEXIDINE GLUCONATE 0.12 % MT SOLN
15.0000 mL | Freq: Once | OROMUCOSAL | Status: AC
Start: 2022-02-07 — End: 2022-02-07

## 2022-02-07 MED ORDER — CHLORHEXIDINE GLUCONATE 0.12 % MT SOLN
OROMUCOSAL | Status: AC
  Administered 2022-02-07: 15 mL via OROMUCOSAL
  Filled 2022-02-07: qty 15

## 2022-02-07 MED ORDER — DEXAMETHASONE SODIUM PHOSPHATE 10 MG/ML IJ SOLN
INTRAMUSCULAR | Status: AC
  Filled 2022-02-07: qty 1

## 2022-02-07 MED ORDER — SUCCINYLCHOLINE CHLORIDE 200 MG/10ML IV SOSY
PREFILLED_SYRINGE | INTRAVENOUS | Status: AC
  Filled 2022-02-07: qty 10

## 2022-02-07 MED ORDER — BUPIVACAINE-EPINEPHRINE (PF) 0.25% -1:200000 IJ SOLN
INTRAMUSCULAR | Status: AC
  Filled 2022-02-07: qty 30

## 2022-02-07 MED ORDER — LACTATED RINGERS IV SOLN: INTRAVENOUS | Status: DC | PRN

## 2022-02-07 MED ORDER — OXYCODONE HCL 5 MG PO TABS: 5.0000 mg | ORAL_TABLET | Freq: Once | ORAL | Status: DC | PRN

## 2022-02-07 MED ORDER — SODIUM CHLORIDE 0.9 % IV BOLUS: 500.0000 mL | Freq: Once | INTRAVENOUS | Status: DC

## 2022-02-07 MED ORDER — FENTANYL CITRATE (PF) 100 MCG/2ML IJ SOLN: 25.0000 ug | INTRAMUSCULAR | Status: DC | PRN

## 2022-02-07 MED ORDER — PROPOFOL 10 MG/ML IV BOLUS
INTRAVENOUS | Status: AC
  Filled 2022-02-07: qty 20

## 2022-02-07 MED ORDER — OXYCODONE HCL 5 MG PO TABS
5.0000 mg | ORAL_TABLET | ORAL | 0 refills | Status: DC | PRN
  Filled 2022-02-07: qty 30, 5d supply, fill #0

## 2022-02-07 MED ORDER — FENTANYL CITRATE (PF) 250 MCG/5ML IJ SOLN
INTRAMUSCULAR | Status: DC | PRN
  Administered 2022-02-07: 50 ug via INTRAVENOUS
  Administered 2022-02-07: 25 ug via INTRAVENOUS
  Administered 2022-02-07 (×2): 50 ug via INTRAVENOUS

## 2022-02-07 MED ORDER — BUPIVACAINE-EPINEPHRINE 0.25% -1:200000 IJ SOLN
INTRAMUSCULAR | Status: DC | PRN
  Administered 2022-02-07: 30 mL

## 2022-02-07 MED ORDER — ACETAMINOPHEN 500 MG PO TABS
1000.0000 mg | ORAL_TABLET | Freq: Four times a day (QID) | ORAL | Status: DC
  Administered 2022-02-07 – 2022-02-10 (×10): 1000 mg via ORAL
  Filled 2022-02-07 (×11): qty 2

## 2022-02-07 MED ORDER — PROPOFOL 10 MG/ML IV BOLUS
INTRAVENOUS | Status: DC | PRN
  Administered 2022-02-07: 150 mg via INTRAVENOUS

## 2022-02-07 MED ORDER — ALBUMIN HUMAN 5 % IV SOLN: INTRAVENOUS | Status: DC | PRN

## 2022-02-07 MED ORDER — ROCURONIUM BROMIDE 10 MG/ML (PF) SYRINGE
PREFILLED_SYRINGE | INTRAVENOUS | Status: DC | PRN
  Administered 2022-02-07: 50 mg via INTRAVENOUS
  Administered 2022-02-07: 10 mg via INTRAVENOUS

## 2022-02-07 MED ORDER — ACETAMINOPHEN 500 MG PO TABS: 1000.0000 mg | ORAL_TABLET | Freq: Once | ORAL | Status: AC

## 2022-02-07 MED ORDER — LACTATED RINGERS IV SOLN: INTRAVENOUS | Status: DC

## 2022-02-07 MED ORDER — METHOCARBAMOL 750 MG PO TABS
750.0000 mg | ORAL_TABLET | Freq: Four times a day (QID) | ORAL | 1 refills | Status: DC
  Filled 2022-02-07: qty 120, 30d supply, fill #0

## 2022-02-07 MED ORDER — ONDANSETRON 4 MG PO TBDP: 4.0000 mg | ORAL_TABLET | Freq: Four times a day (QID) | ORAL | Status: DC | PRN

## 2022-02-07 MED ORDER — ONDANSETRON HCL 4 MG/2ML IJ SOLN
4.0000 mg | Freq: Four times a day (QID) | INTRAMUSCULAR | Status: DC | PRN
  Administered 2022-02-08 – 2022-02-09 (×5): 4 mg via INTRAVENOUS
  Filled 2022-02-07 (×5): qty 2

## 2022-02-07 MED ORDER — ONDANSETRON HCL 4 MG/2ML IJ SOLN
4.0000 mg | Freq: Once | INTRAMUSCULAR | Status: DC | PRN
Start: 2022-02-07 — End: 2022-02-07

## 2022-02-07 MED ORDER — PHENYLEPHRINE HCL-NACL 20-0.9 MG/250ML-% IV SOLN
INTRAVENOUS | Status: DC | PRN
  Administered 2022-02-07: 25 ug/min via INTRAVENOUS

## 2022-02-07 MED ORDER — ONDANSETRON 4 MG PO TBDP
4.0000 mg | ORAL_TABLET | Freq: Once | ORAL | Status: AC
Start: 2022-02-07 — End: 2022-02-07
  Administered 2022-02-07: 4 mg via ORAL
  Filled 2022-02-07: qty 1

## 2022-02-07 MED ORDER — MIDAZOLAM HCL 2 MG/2ML IJ SOLN
INTRAMUSCULAR | Status: DC | PRN
  Administered 2022-02-07: 2 mg via INTRAVENOUS

## 2022-02-07 MED ORDER — LIDOCAINE 2% (20 MG/ML) 5 ML SYRINGE
INTRAMUSCULAR | Status: DC | PRN
  Administered 2022-02-07: 60 mg via INTRAVENOUS

## 2022-02-07 MED ORDER — EPHEDRINE 5 MG/ML INJ
INTRAVENOUS | Status: AC
Start: 2022-02-07 — End: ?
  Filled 2022-02-07: qty 5

## 2022-02-07 MED ORDER — SUGAMMADEX SODIUM 200 MG/2ML IV SOLN
INTRAVENOUS | Status: DC | PRN
  Administered 2022-02-07: 150 mg via INTRAVENOUS

## 2022-02-07 MED ORDER — IBUPROFEN 600 MG PO TABS
600.0000 mg | ORAL_TABLET | Freq: Four times a day (QID) | ORAL | Status: DC
  Administered 2022-02-07 – 2022-02-10 (×10): 600 mg via ORAL
  Filled 2022-02-07 (×10): qty 1

## 2022-02-07 MED ORDER — LIDOCAINE 2% (20 MG/ML) 5 ML SYRINGE
INTRAMUSCULAR | Status: AC
  Filled 2022-02-07: qty 5

## 2022-02-07 MED ORDER — ACETAMINOPHEN 500 MG PO TABS
1000.0000 mg | ORAL_TABLET | Freq: Four times a day (QID) | ORAL | 3 refills | Status: AC | PRN
  Filled 2022-02-07: qty 120, 20d supply, fill #0

## 2022-02-07 MED ORDER — ONDANSETRON HCL 4 MG/2ML IJ SOLN
INTRAMUSCULAR | Status: DC | PRN
  Administered 2022-02-07: 4 mg via INTRAVENOUS

## 2022-02-07 MED ORDER — IOHEXOL 300 MG/ML  SOLN
75.0000 mL | Freq: Once | INTRAMUSCULAR | Status: AC | PRN
  Administered 2022-02-07: 75 mL via INTRAVENOUS

## 2022-02-07 MED ORDER — 0.9 % SODIUM CHLORIDE (POUR BTL) OPTIME
TOPICAL | Status: DC | PRN
  Administered 2022-02-07: 1000 mL

## 2022-02-07 SURGICAL SUPPLY — 46 items
APPLIER CLIP ROT 10 11.4 M/L (STAPLE)
BAG COUNTER SPONGE SURGICOUNT (BAG) ×2 IMPLANT
BLADE CLIPPER SURG (BLADE) ×1 IMPLANT
CANISTER SUCT 3000ML PPV (MISCELLANEOUS) ×1 IMPLANT
CHLORAPREP W/TINT 26 (MISCELLANEOUS) ×2 IMPLANT
CLIP APPLIE ROT 10 11.4 M/L (STAPLE) IMPLANT
COVER SURGICAL LIGHT HANDLE (MISCELLANEOUS) ×2 IMPLANT
CUTTER FLEX LINEAR 45M (STAPLE) ×2 IMPLANT
DERMABOND ADHESIVE PROPEN (GAUZE/BANDAGES/DRESSINGS) ×1
DERMABOND ADVANCED .7 DNX6 (GAUZE/BANDAGES/DRESSINGS) ×1 IMPLANT
ELECT CAUTERY BLADE 6.4 (BLADE) ×2 IMPLANT
ELECT REM PT RETURN 9FT ADLT (ELECTROSURGICAL) ×2
ELECTRODE REM PT RTRN 9FT ADLT (ELECTROSURGICAL) ×1 IMPLANT
ENDOLOOP SUT PDS II  0 18 (SUTURE)
ENDOLOOP SUT PDS II 0 18 (SUTURE) IMPLANT
GLOVE BIO SURGEON STRL SZ 6.5 (GLOVE) ×2 IMPLANT
GLOVE BIOGEL PI IND STRL 6 (GLOVE) ×1 IMPLANT
GLOVE BIOGEL PI INDICATOR 6 (GLOVE) ×1
GOWN STRL REUS W/ TWL LRG LVL3 (GOWN DISPOSABLE) ×3 IMPLANT
GOWN STRL REUS W/TWL LRG LVL3 (GOWN DISPOSABLE) ×3
KIT BASIN OR (CUSTOM PROCEDURE TRAY) ×2 IMPLANT
KIT TURNOVER KIT B (KITS) ×2 IMPLANT
NDL INSUFFLATION 14GA 120MM (NEEDLE) IMPLANT
NEEDLE INSUFFLATION 14GA 120MM (NEEDLE) IMPLANT
NS IRRIG 1000ML POUR BTL (IV SOLUTION) ×2 IMPLANT
PAD ARMBOARD 7.5X6 YLW CONV (MISCELLANEOUS) ×4 IMPLANT
PENCIL BUTTON HOLSTER BLD 10FT (ELECTRODE) ×2 IMPLANT
POUCH SPECIMEN RETRIEVAL 10MM (ENDOMECHANICALS) ×2 IMPLANT
RELOAD 45 VASCULAR/THIN (ENDOMECHANICALS) ×2 IMPLANT
RELOAD STAPLE 45 2.5 WHT GRN (ENDOMECHANICALS) IMPLANT
RELOAD STAPLE 45 3.5 BLU ETS (ENDOMECHANICALS) IMPLANT
RELOAD STAPLE TA45 3.5 REG BLU (ENDOMECHANICALS) ×4 IMPLANT
SCISSORS LAP 5X35 DISP (ENDOMECHANICALS) IMPLANT
SET IRRIG TUBING LAPAROSCOPIC (IRRIGATION / IRRIGATOR) IMPLANT
SET TUBE SMOKE EVAC HIGH FLOW (TUBING) ×2 IMPLANT
SHEARS HARMONIC ACE PLUS 36CM (ENDOMECHANICALS) IMPLANT
SLEEVE ENDOPATH XCEL 5M (ENDOMECHANICALS) ×2 IMPLANT
SPECIMEN JAR SMALL (MISCELLANEOUS) ×2 IMPLANT
SUT MNCRL AB 4-0 PS2 18 (SUTURE) ×2 IMPLANT
SUT VICRYL 0 UR6 27IN ABS (SUTURE) ×1 IMPLANT
TOWEL GREEN STERILE (TOWEL DISPOSABLE) ×2 IMPLANT
TOWEL GREEN STERILE FF (TOWEL DISPOSABLE) ×2 IMPLANT
TRAY LAPAROSCOPIC MC (CUSTOM PROCEDURE TRAY) ×2 IMPLANT
TROCAR XCEL BLUNT TIP 100MML (ENDOMECHANICALS) ×1 IMPLANT
TROCAR XCEL NON-BLD 5MMX100MML (ENDOMECHANICALS) ×2 IMPLANT
WATER STERILE IRR 1000ML POUR (IV SOLUTION) ×2 IMPLANT

## 2022-02-07 NOTE — Op Note (Signed)
   Operative Note   Date: 02/07/2022  Procedure: laparoscopic appendectomy  Pre-op diagnosis: acute appendicitis Post-op diagnosis: Grade 1c appendicitis: gangrenous/necrotic appendix without perforation  Indication and clinical history: The patient is a 66 y.o. year old female with acute appendicitis     Surgeon: Diamantina Monks, MD  Anesthesiologist: Mal Amabile, MD Anesthesia: General  Findings:  Specimen: appendix EBL: 10cc Drains/Implants: none  Disposition: PACU - hemodynamically stable.  Description of procedure: The patient was positioned supine on the operating room table. Time-out was performed verifying correct patient, procedure, signature of informed consent, and administration of pre-operative antibiotics. General anesthetic induction and intubation were uneventful. Foley catheter insertion was not performed as patient voided immediately prior to the procedure . The abdomen was prepped and draped in the usual sterile fashion. An infra-umbilical incision was made using an open technique using zero vicryl stay sutures on either side of the fascia and a 40mm Hassan port inserted. After establishing pneumoperitoneum, which the patient tolerated well, the abdominal cavity was inspected and no injury of any intra-abdominal structures was identified. Two additional five millimeter ports were placed under direct visualization and using local anesthetic in the suprapubic and left lower quadrant regions. The patient was repositioned to Trendelenburg with the left side down. Further inspection of the right lower quadrant revealed grade 1c appendicitis. Adhesiolysis was performed for approximately 20 minutes during dissection, mobilization, and identification of the appendix. The appendix was dissected away from its mesoappendix and an endoscopic stapler used to divide the mesoappendix using a vascular load. A bowel load of the endoscopic stapler was used to staple across the appendix at its  base. Both staple lines were inspected and found to be intact and without bleeding. The appendix was placed in an endoscopic specimen retrieval bag, removed via the umbilical port site, and sent to pathology as a permanent specimen. The right lower quadrant was again inspected and hemostasis confirmed. Any remaining fluid identified was suctioned. The suprapubic and left lower quadrant ports were removed under direct visualization and hemostasis confirmed. The umbilical port was removed last after desufflating the abdomen and the fascia re-approximated using the stay sutures. Additional local anesthetic was administered at the umbilical incision site. The skin of all port sites was closed with 4-0 monocryl. Sterile dressings were applied. All sponge and instrument counts were correct at the conclusion of the procedure. The patient was awakened from anesthesia, extubated uneventfully, and transported to the PACU in good condition. There were no complications.   Upon entering the abdomen (organ space), I encountered infection of the appendix .  CASE DATA:  Type of patient?: DOW CASE (Surgical Hospitalist [facility])  Status of Case? URGENT Add On  Infection Present At Time Of Surgery (PATOS)?  INFECTION of the appendix   Diamantina Monks, MD General and Trauma Surgery [facility]

## 2022-02-07 NOTE — Anesthesia Procedure Notes (Signed)
Procedure Name: Intubation Date/Time: 02/07/2022 5:50 PM  Performed by: Dorthea Cove, CRNAPre-anesthesia Checklist: Patient identified, Emergency Drugs available, Suction available and Patient being monitored Patient Re-evaluated:Patient Re-evaluated prior to induction Oxygen Delivery Method: Circle system utilized Preoxygenation: Pre-oxygenation with 100% oxygen Induction Type: IV induction, Rapid sequence and Cricoid Pressure applied Laryngoscope Size: Mac and 3 Grade View: Grade I Tube type: Oral Tube size: 7.0 mm Number of attempts: 1 Airway Equipment and Method: Stylet and Oral airway Placement Confirmation: ETT inserted through vocal cords under direct vision, positive ETCO2 and breath sounds checked- equal and bilateral Secured at: 21 cm Tube secured with: Tape Dental Injury: Teeth and Oropharynx as per pre-operative assessment

## 2022-02-07 NOTE — Anesthesia Postprocedure Evaluation (Signed)
Anesthesia Post Note  Patient: Angela Giles  Procedure(s) Performed: APPENDECTOMY LAPAROSCOPIC (Abdomen)     Patient location during evaluation: PACU Anesthesia Type: General Level of consciousness: awake and alert Pain management: pain level controlled Vital Signs Assessment: post-procedure vital signs reviewed and stable Respiratory status: spontaneous breathing, nonlabored ventilation and respiratory function stable Cardiovascular status: stable and blood pressure returned to baseline Anesthetic complications: no   No notable events documented.  Last Vitals:  Vitals:   02/07/22 1930 02/07/22 1945  BP: 111/88 109/69  Pulse: 81 74  Resp: 18 18  Temp:    SpO2: 93% 94%    Last Pain:  Vitals:   02/07/22 1945  TempSrc:   PainSc: 0-No pain                 Beryle Lathe

## 2022-02-07 NOTE — ED Triage Notes (Signed)
Complaiins of right lower quad pain.

## 2022-02-07 NOTE — Anesthesia Preprocedure Evaluation (Addendum)
Anesthesia Evaluation  Patient identified by MRN, date of birth, ID band Patient awake    Reviewed: Allergy & Precautions, NPO status , Patient's Chart, lab work & pertinent test results  History of Anesthesia Complications Negative for: history of anesthetic complications  Airway Mallampati: II  TM Distance: >3 FB Neck ROM: Full    Dental  (+) Dental Advisory Given, Missing, Chipped   Pulmonary sleep apnea , COPD,  COPD inhaler, former smoker,    Pulmonary exam normal        Cardiovascular hypertension, Pt. on medications Normal cardiovascular exam     Neuro/Psych Seizures -, Well Controlled,  negative psych ROS   GI/Hepatic Neg liver ROS, GERD  Medicated and Controlled, Appendicitis    Endo/Other   SIADH   Renal/GU negative Renal ROS     Musculoskeletal  Chronic back pain    Abdominal   Peds  Hematology negative hematology ROS (+)   Anesthesia Other Findings   Reproductive/Obstetrics                            Anesthesia Physical Anesthesia Plan  ASA: 3  Anesthesia Plan: General   Post-op Pain Management: Tylenol PO (pre-op)* and Celebrex PO (pre-op)*   Induction: Intravenous and Rapid sequence  PONV Risk Score and Plan: 3 and Treatment may vary due to age or medical condition, Ondansetron, Dexamethasone and Midazolam  Airway Management Planned: Oral ETT  Additional Equipment: None  Intra-op Plan:   Post-operative Plan: Extubation in OR  Informed Consent: I have reviewed the patients History and Physical, chart, labs and discussed the procedure including the risks, benefits and alternatives for the proposed anesthesia with the patient or authorized representative who has indicated his/her understanding and acceptance.     Dental advisory given  Plan Discussed with: CRNA and Anesthesiologist  Anesthesia Plan Comments:        Anesthesia Quick Evaluation

## 2022-02-07 NOTE — ED Notes (Signed)
Report given to [facility], RN pt going to [facility] Short Stay  [phone]

## 2022-02-07 NOTE — Transfer of Care (Signed)
Immediate Anesthesia Transfer of Care Note  Patient: Angela Giles  Procedure(s) Performed: APPENDECTOMY LAPAROSCOPIC (Abdomen)  Patient Location: PACU  Anesthesia Type:General  Level of Consciousness: awake, alert  and oriented  Airway & Oxygen Therapy: Patient Spontanous Breathing and Patient connected to nasal cannula oxygen  Post-op Assessment: Report given to RN and Post -op Vital signs reviewed and stable  Post vital signs: Reviewed and stable  Last Vitals:  Vitals Value Taken Time  BP 136/37 02/07/22 1909  Temp    Pulse 90 02/07/22 1912  Resp 22 02/07/22 1912  SpO2 92 % 02/07/22 1912  Vitals shown include unvalidated device data.  Last Pain:  Vitals:   02/07/22 1719  TempSrc:   PainSc: 5          Complications: No notable events documented.

## 2022-02-07 NOTE — Consult Note (Signed)
Reason for Consult/Chief Complaint: acute appendicitis Consultant: Fredderick Phenix, MD  LOANY NEUROTH is an 66 y.o. female.   HPI: 81F with abd pain that awakened her from sleep last PM. No n/v, changes to BMS  Past Medical History:  Diagnosis Date   Chronic back pain    COPD (chronic obstructive pulmonary disease) (HCC)    Dorsalgia    High cholesterol    Hypertension    Seizures (HCC)     Past Surgical History:  Procedure Laterality Date   ABDOMINAL HYSTERECTOMY     CHOLECYSTECTOMY      Family History  Problem Relation Age of Onset   Dementia Mother    Heart attack Father    Diabetes Father    Lupus Sister     Social History:  reports that she has quit smoking. She has never used smokeless tobacco. She reports current alcohol use. She reports that she does not use drugs.  Allergies:  Allergies  Allergen Reactions   Beef-Derived Products    Fish Allergy    Milk-Related Compounds     Medications: I have reviewed the patient's current medications.  Results for orders placed or performed during the hospital encounter of 02/07/22 (from the past 48 hour(s))  Urinalysis, Routine w reflex microscopic     Status: Abnormal   Collection Time: 02/07/22 11:25 AM  Result Value Ref Range   Color, Urine YELLOW YELLOW   APPearance HAZY (A) CLEAR   Specific Gravity, Urine 1.015 1.005 - 1.030   pH 7.0 5.0 - 8.0   Glucose, UA NEGATIVE NEGATIVE mg/dL   Hgb urine dipstick NEGATIVE NEGATIVE   Bilirubin Urine NEGATIVE NEGATIVE   Ketones, ur NEGATIVE NEGATIVE mg/dL   Protein, ur NEGATIVE NEGATIVE mg/dL   Nitrite NEGATIVE NEGATIVE   Leukocytes,Ua NEGATIVE NEGATIVE    Comment: Performed at [facility], [address] [place], [place] [address]  CBC with Differential     Status: Abnormal   Collection Time: 02/07/22 11:43 AM  Result Value Ref Range   WBC 14.6 (H) 4.0 - 10.5 K/uL   RBC 4.74 3.87 - 5.11 MIL/uL   Hemoglobin 13.8 12.0 - 15.0 g/dL   HCT 41.9 37.9 - 02.4 %    MCV 83.1 80.0 - 100.0 fL   MCH 29.1 26.0 - 34.0 pg   MCHC 35.0 30.0 - 36.0 g/dL   RDW 09.7 35.3 - 29.9 %   Platelets 368 150 - 400 K/uL   nRBC 0.0 0.0 - 0.2 %   Neutrophils Relative % 79 %   Neutro Abs 11.5 (H) 1.7 - 7.7 K/uL   Lymphocytes Relative 14 %   Lymphs Abs 2.0 0.7 - 4.0 K/uL   Monocytes Relative 6 %   Monocytes Absolute 0.9 0.1 - 1.0 K/uL   Eosinophils Relative 1 %   Eosinophils Absolute 0.1 0.0 - 0.5 K/uL   Basophils Relative 0 %   Basophils Absolute 0.0 0.0 - 0.1 K/uL   Immature Granulocytes 0 %   Abs Immature Granulocytes 0.06 0.00 - 0.07 K/uL    Comment: Performed at [facility], [address], [place], [place] [address]  Comprehensive metabolic panel     Status: Abnormal   Collection Time: 02/07/22 11:43 AM  Result Value Ref Range   Sodium 134 (L) 135 - 145 mmol/L   Potassium 4.2 3.5 - 5.1 mmol/L   Chloride 99 98 - 111 mmol/L   CO2 27 22 - 32 mmol/L   Glucose, Bld  128 (H) 70 - 99 mg/dL    Comment: Glucose reference range applies only to samples taken after fasting for at least 8 hours.   BUN 8 8 - 23 mg/dL   Creatinine, Ser 6.64 0.44 - 1.00 mg/dL   Calcium 9.6 8.9 - 40.3 mg/dL   Total Protein 7.4 6.5 - 8.1 g/dL   Albumin 4.2 3.5 - 5.0 g/dL   AST 17 15 - 41 U/L   ALT 10 0 - 44 U/L   Alkaline Phosphatase 91 38 - 126 U/L   Total Bilirubin 0.6 0.3 - 1.2 mg/dL   GFR, Estimated >47 >42 mL/min    Comment: (NOTE) Calculated using the CKD-EPI Creatinine Equation (2021)    Anion gap 8 5 - 15    Comment: Performed at [facility], [address], [place], [place] [address]  Lipase, blood     Status: None   Collection Time: 02/07/22 11:43 AM  Result Value Ref Range   Lipase 30 11 - 51 U/L    Comment: Performed at [facility], [address], [place], [place] [address]    CT Abdomen Pelvis W Contrast  Result Date: 02/07/2022 CLINICAL DATA:  Right lower quadrant abdominal pain. EXAM: CT ABDOMEN AND PELVIS WITH CONTRAST TECHNIQUE: Multidetector  CT imaging of the abdomen and pelvis was performed using the standard protocol following bolus administration of intravenous contrast. RADIATION DOSE REDUCTION: This exam was performed according to the departmental dose-optimization program which includes automated exposure control, adjustment of the mA and/or kV according to patient size and/or use of iterative reconstruction technique. CONTRAST:  19mL OMNIPAQUE IOHEXOL 300 MG/ML  SOLN COMPARISON:  None available FINDINGS: Lower chest: Mild dependent atelectasis is present both lung bases. Heart size is normal. No significant pleural or pericardial effusion is present. Hepatobiliary: No focal liver abnormality is seen. Status post cholecystectomy. No biliary dilatation. Pancreas: Unremarkable. No pancreatic ductal dilatation or surrounding inflammatory changes. Spleen: Normal in size without focal abnormality. Adrenals/Urinary Tract: Adrenal glands are unremarkable. Kidneys are normal, without renal calculi, focal lesion, or hydronephrosis. Bladder is unremarkable. Stomach/Bowel: The stomach and duodenum are within normal limits. Proximal small bowel is unremarkable. Inflammatory changes are noted about the terminal ileum. The source of inflammation appears to be the appendix which is enlarged, measuring 12 mm in diameter. Extensive surrounding soft tissue stranding and minimal layering fluid is noted. The ascending colon is otherwise within normal limits. Transverse colon is normal. Diverticular changes are present in the descending and sigmoid colon without focal inflammatory changes to suggest colitis. Vascular/Lymphatic: Mild atherosclerotic changes are present within the abdominal aorta. No aneurysm or definite stenosis. No significant pelvic adenopathy is present. Reproductive: Status post hysterectomy. No adnexal masses. Other: A paraumbilical hernia contains fat without bowel. No other significant ventral hernia is present. No other free fluid or free air  is present. Musculoskeletal: Slight retrolisthesis at L5-S1 is associated degenerative facet disease. Mild leftward curvature is centered at L3-4. Vertebral body heights are maintained. No focal osseous lesions are present. Degenerative changes noted at the SI joints. The hips are located and limits. IMPRESSION: 1. Acute appendicitis without evidence for rupture or abscess. 2. Inflammatory changes about the terminal ileum are secondary to the acute appendicitis 3. Descending and sigmoid diverticulosis without diverticulitis. 4. Status post cholecystectomy and hysterectomy. 5. Aortic Atherosclerosis (ICD10-I70.0). These results were called by telephone at the time of interpretation on 02/07/2022 at 2:32 pm to provider HALEY SAGE , who verbally acknowledged these results. Electronically  Signed   By: Marin Roberts M.D.   On: 02/07/2022 14:32    ROS 10 point review of systems is negative except as listed above in HPI.   Physical Exam Blood pressure 118/63, pulse 88, temperature 99 F (37.2 C), resp. rate 16, height 5' (1.524 m), weight 65.8 kg, SpO2 100 %. Constitutional: well-developed, well-nourished HEENT: pupils equal, round, reactive to light, 50mm b/l, moist conjunctiva, external inspection of ears and nose normal, hearing intact Oropharynx: normal oropharyngeal mucosa, poor dentition Neck: no thyromegaly, trachea midline, no midline cervical tenderness to palpation Chest: breath sounds equal bilaterally, normal respiratory effort, no midline or lateral chest wall tenderness to palpation/deformity Abdomen: soft, NT, no bruising, no hepatosplenomegaly GU: normal female genitalia  Back: no wounds, no thoracic/lumbar spine tenderness to palpation, no thoracic/lumbar spine stepoffs Rectal: deferred Extremities: 2+ radial and pedal pulses bilaterally, intact motor and sensation bilateral UE and LE, no peripheral edema MSK: unable to assess gait/station, no clubbing/cyanosis of fingers/toes,  normal ROM of all four extremities Skin: warm, dry, no rashes Psych: normal memory, normal mood/affect     Assessment/Plan: 61F with acute appendicitis. Discussed surgical and nonsurgical management options. Patient elects for lap appy. Informed consent was obtained after detailed explanation of risks, including bleeding, infection, abscess, staple line leak, stump appendicitis, injury to surrounding structures, and need for conversion to open procedure. All questions answered to the patient's satisfaction.   Diamantina Monks, MD General and Trauma Surgery [facility]

## 2022-02-07 NOTE — ED Provider Triage Note (Signed)
Emergency Medicine Provider Triage Evaluation Note  Angela Giles , a 66 y.o. female  was evaluated in triage.  Pt complains of 6/10 lower, constant, dull abdominal pain onset yesterday. Non-radiating.  Patient notes that she longer has her gallbladder and it was removed in 1992.  Notes that she still has her appendix.  Also notes that she has had a total hysterectomy does not have her ovaries at this time.  Has associated nausea and dysuria.  Denies vomiting, hematuria.  Review of Systems  Positive: As per HPI Negative:   Physical Exam  BP 110/70 (BP Location: Left Arm)   Pulse 94   Temp 97.7 F (36.5 C) (Oral)   Resp 16   Ht 5' (1.524 m)   Wt 65.8 kg   SpO2 99%   BMI 28.32 kg/m  Gen:   Awake, no distress   Resp:  Normal effort  MSK:   Moves extremities without difficulty  Other:  Right CVA tenderness to palpation.  Tenderness to palpation noted to right lower quadrant.  Mild tenderness to palpation noted to suprapubic region.  Able to ambulate with walker without difficulty.  Medical Decision Making  Medically screening exam initiated at 11:31 AM.  Appropriate orders placed.  Angela Giles was informed that the remainder of the evaluation will be completed by another provider, this initial triage assessment does not replace that evaluation, and the importance of remaining in the ED until their evaluation is complete.  Work-up initiated.   Porfirio Bollier A, PA-C 02/07/22 1141

## 2022-02-07 NOTE — ED Provider Notes (Signed)
[facility] EMERGENCY DEPARTMENT Provider Note   CSN: [id number] Arrival date & time: 02/07/22  1124     History  Chief Complaint  Patient presents with   Abdominal Pain    MELIZA Giles is a 66 y.o. female.  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  She presents with abdominal pain.  She says it started during the night last night.  Its in the right lower abdomen.  She has had some associated nausea.  No fevers.  No urinary symptoms.  She is status post prior cholecystectomy and hysterectomy.  Last p.o. intake was about 7 AM this morning.       Home Medications Prior to Admission medications   Medication Sig Start Date End Date Taking? Authorizing Provider  albuterol (PROVENTIL) (2.5 MG/3ML) 0.083% nebulizer solution Take 3 mLs (2.5 mg total) by nebulization every 6 (six) hours as needed for wheezing or shortness of breath. 10/09/21   Curatolo, Adam, DO  albuterol (VENTOLIN HFA) 108 (90 Base) MCG/ACT inhaler Inhale 1-2 puffs into the lungs every 4 (four) hours as needed for shortness of breath or wheezing. 12/08/21   Georgina Quint, MD  APTIOM 400 MG TABS Take 1 tablet (400 mg total) by mouth daily. 12/22/21   Van Clines, MD  aspirin 81 MG EC tablet Take 81 mg by mouth daily. Swallow whole.    [provider]  diphenhydrAMINE (BENADRYL) 25 MG tablet Take 25 mg by mouth at bedtime as needed for allergies.    [provider]  hydrOXYzine (ATARAX) 25 MG tablet Take 1 tablet (25 mg total) by mouth at bedtime as needed. 02/02/22   Georgina Quint, MD  ibuprofen (ADVIL) 600 MG tablet Take 1 tablet (600 mg total) by mouth every 6 (six) hours as needed. Patient not taking: Reported on 12/22/2021 03/14/21   Tanda Rockers A, DO  ipratropium (ATROVENT HFA) 17 MCG/ACT inhaler Inhale 2 puffs into the lungs every 6 (six) hours as needed for wheezing. 03/14/21    Sloan Leiter, DO  lacosamide (VIMPAT) 200 MG TABS tablet Take 1 tablet (200 mg total) by mouth 2 (two) times daily. 12/22/21   Van Clines, MD  losartan (COZAAR) 50 MG tablet Take 1 tablet (50 mg total) by mouth daily. 10/19/21   Georgina Quint, MD  Melatonin 5 MG CAPS Take 5 mg by mouth at bedtime as needed (sleep).    [provider]  methocarbamol (ROBAXIN) 500 MG tablet Take 1 tablet (500 mg total) by mouth 2 (two) times daily as needed for muscle spasms. 12/08/21   Georgina Quint, MD  montelukast (SINGULAIR) 5 MG chewable tablet Chew 10 mg by mouth at bedtime. 03/19/21   [provider]  omeprazole (PRILOSEC) 20 MG capsule Take 20 mg by mouth daily.    [provider]  ondansetron (ZOFRAN) 4 MG tablet Take 1 tablet (4 mg total) by mouth every 6 (six) hours as needed for nausea or vomiting. 12/17/21   Gwyneth Sprout, MD  polyethylene glycol (MIRALAX) 17 g packet Take 17 g by mouth daily. 10/12/21   Burnadette Pop, MD  potassium chloride SA (KLOR-CON M) 20 MEQ tablet Take 2 tablets (40 mEq total) by mouth daily for 3 days. 10/13/21 12/22/21  Burnadette Pop, MD  simvastatin (ZOCOR) 20 MG tablet Take 1 tablet (20 mg total) by mouth at bedtime. 02/01/22   Georgina Quint, MD  SYMBICORT 160-4.5 MCG/ACT inhaler Inhale 2 puffs into the lungs in the morning and at bedtime. 09/24/21   [provider]      Allergies    Beef-derived products, Fish allergy, and Milk-related compounds    Review of Systems   Review of Systems  Constitutional:  Negative for chills, diaphoresis, fatigue and fever.  HENT:  Negative for congestion, rhinorrhea and sneezing.   Eyes: Negative.   Respiratory:  Negative for cough, chest tightness and shortness of breath.   Cardiovascular:  Negative for chest pain and leg swelling.  Gastrointestinal:  Positive for abdominal pain and nausea. Negative for blood in stool, diarrhea and vomiting.  Genitourinary:  Negative for  difficulty urinating, flank pain, frequency and hematuria.  Musculoskeletal:  Negative for arthralgias and back pain.  Skin:  Negative for rash.  Neurological:  Negative for dizziness, speech difficulty, weakness, numbness and headaches.    Physical Exam Updated Vital Signs BP 118/63 (BP Location: Right Arm)   Pulse 88   Temp 99 F (37.2 C)   Resp 16   Ht 5' (1.524 m)   Wt 65.8 kg   SpO2 100%   BMI 28.32 kg/m  Physical Exam Constitutional:      Appearance: She is well-developed.  HENT:     Head: Normocephalic and atraumatic.  Eyes:     Pupils: Pupils are equal, round, and reactive to light.  Cardiovascular:     Rate and Rhythm: Normal rate and regular rhythm.     Heart sounds: Normal heart sounds.  Pulmonary:     Effort: Pulmonary effort is normal. No respiratory distress.     Breath sounds: Normal breath sounds. No wheezing or rales.  Chest:     Chest wall: No tenderness.  Abdominal:     General: Bowel sounds are normal.     Palpations: Abdomen is soft.     Tenderness: There is abdominal tenderness in the right lower quadrant. There is no guarding or rebound.  Musculoskeletal:        General: Normal range of motion.     Cervical back: Normal range of motion and neck supple.  Lymphadenopathy:     Cervical: No cervical adenopathy.  Skin:    General: Skin is warm and dry.     Findings: No rash.  Neurological:     Mental Status: She is alert and oriented to person, place, and time.     ED Results / Procedures / Treatments   Labs (all labs ordered are listed, but only abnormal results are displayed) Labs Reviewed  CBC WITH DIFFERENTIAL/PLATELET - Abnormal; Notable for the following components:      Result Value   WBC 14.6 (*)    Neutro Abs 11.5 (*)    All other components within normal limits  COMPREHENSIVE METABOLIC PANEL - Abnormal; Notable for the following components:   Sodium 134 (*)    Glucose, Bld 128 (*)    All other components within normal limits   URINALYSIS, ROUTINE W REFLEX MICROSCOPIC - Abnormal; Notable for the following components:   APPearance HAZY (*)    All other components within normal limits  LIPASE, BLOOD    EKG None  Radiology CT Abdomen Pelvis W Contrast  Result Date: 02/07/2022 CLINICAL DATA:  Right lower quadrant abdominal pain. EXAM: CT ABDOMEN AND PELVIS WITH CONTRAST TECHNIQUE: Multidetector CT imaging of the abdomen and pelvis was performed using the standard protocol following bolus administration of intravenous contrast. RADIATION DOSE REDUCTION: This exam was performed according  to the departmental dose-optimization program which includes automated exposure control, adjustment of the mA and/or kV according to patient size and/or use of iterative reconstruction technique. CONTRAST:  6mL OMNIPAQUE IOHEXOL 300 MG/ML  SOLN COMPARISON:  None available FINDINGS: Lower chest: Mild dependent atelectasis is present both lung bases. Heart size is normal. No significant pleural or pericardial effusion is present. Hepatobiliary: No focal liver abnormality is seen. Status post cholecystectomy. No biliary dilatation. Pancreas: Unremarkable. No pancreatic ductal dilatation or surrounding inflammatory changes. Spleen: Normal in size without focal abnormality. Adrenals/Urinary Tract: Adrenal glands are unremarkable. Kidneys are normal, without renal calculi, focal lesion, or hydronephrosis. Bladder is unremarkable. Stomach/Bowel: The stomach and duodenum are within normal limits. Proximal small bowel is unremarkable. Inflammatory changes are noted about the terminal ileum. The source of inflammation appears to be the appendix which is enlarged, measuring 12 mm in diameter. Extensive surrounding soft tissue stranding and minimal layering fluid is noted. The ascending colon is otherwise within normal limits. Transverse colon is normal. Diverticular changes are present in the descending and sigmoid colon without focal inflammatory changes to  suggest colitis. Vascular/Lymphatic: Mild atherosclerotic changes are present within the abdominal aorta. No aneurysm or definite stenosis. No significant pelvic adenopathy is present. Reproductive: Status post hysterectomy. No adnexal masses. Other: A paraumbilical hernia contains fat without bowel. No other significant ventral hernia is present. No other free fluid or free air is present. Musculoskeletal: Slight retrolisthesis at L5-S1 is associated degenerative facet disease. Mild leftward curvature is centered at L3-4. Vertebral body heights are maintained. No focal osseous lesions are present. Degenerative changes noted at the SI joints. The hips are located and limits. IMPRESSION: 1. Acute appendicitis without evidence for rupture or abscess. 2. Inflammatory changes about the terminal ileum are secondary to the acute appendicitis 3. Descending and sigmoid diverticulosis without diverticulitis. 4. Status post cholecystectomy and hysterectomy. 5. Aortic Atherosclerosis (ICD10-I70.0). These results were called by telephone at the time of interpretation on 02/07/2022 at 2:32 pm to provider HALEY SAGE , who verbally acknowledged these results. Electronically Signed   By: Marin Roberts M.D.   On: 02/07/2022 14:32    Procedures Procedures    Medications Ordered in ED Medications  cefTRIAXone (ROCEPHIN) 2 g in sodium chloride 0.9 % 100 mL IVPB (has no administration in time range)    And  metroNIDAZOLE (FLAGYL) IVPB 500 mg (has no administration in time range)  sodium chloride 0.9 % bolus 500 mL (has no administration in time range)  fentaNYL (SUBLIMAZE) injection 50 mcg (has no administration in time range)  ondansetron (ZOFRAN-ODT) disintegrating tablet 4 mg (4 mg Oral Given 02/07/22 1148)  iohexol (OMNIPAQUE) 300 MG/ML solution 75 mL (75 mLs Intravenous Contrast Given 02/07/22 1416)    ED Course/ Medical Decision Making/ A&P                           Medical Decision  Making Risk Prescription drug management.   Patient is a 66 year old female who presents with 1 day history of right lower quadrant abdominal pain.  Her WBC count is elevated.  Urinalysis is negative for infection.  CT scan shows evidence of appendicitis without perforation.  Was started on IV antibiotics, pain medication and IV fluids.  Discussed with Dr. Bonita Quin who will admit the patient.  Final Clinical Impression(s) / ED Diagnoses Final diagnoses:  Acute appendicitis with localized peritonitis, without perforation, abscess, or gangrene    Rx / DC Orders ED Discharge Orders  None         Rolan Bucco, MD 02/07/22 1544

## 2022-02-08 ENCOUNTER — Other Ambulatory Visit (HOSPITAL_COMMUNITY): Payer: Self-pay

## 2022-02-08 ENCOUNTER — Encounter (HOSPITAL_COMMUNITY): Payer: Self-pay | Admitting: Surgery

## 2022-02-08 DIAGNOSIS — K353 Acute appendicitis with localized peritonitis, without perforation or gangrene: Secondary | ICD-10-CM | POA: Diagnosis present

## 2022-02-08 DIAGNOSIS — Z87891 Personal history of nicotine dependence: Secondary | ICD-10-CM | POA: Diagnosis not present

## 2022-02-08 DIAGNOSIS — Z91011 Allergy to milk products: Secondary | ICD-10-CM | POA: Diagnosis not present

## 2022-02-08 DIAGNOSIS — Z91014 Allergy to mammalian meats: Secondary | ICD-10-CM | POA: Diagnosis not present

## 2022-02-08 DIAGNOSIS — I10 Essential (primary) hypertension: Secondary | ICD-10-CM | POA: Diagnosis present

## 2022-02-08 DIAGNOSIS — K3531 Acute appendicitis with localized peritonitis and gangrene, without perforation: Secondary | ICD-10-CM | POA: Diagnosis present

## 2022-02-08 DIAGNOSIS — J449 Chronic obstructive pulmonary disease, unspecified: Secondary | ICD-10-CM | POA: Diagnosis present

## 2022-02-08 DIAGNOSIS — G40909 Epilepsy, unspecified, not intractable, without status epilepticus: Secondary | ICD-10-CM | POA: Diagnosis present

## 2022-02-08 DIAGNOSIS — Z833 Family history of diabetes mellitus: Secondary | ICD-10-CM | POA: Diagnosis not present

## 2022-02-08 DIAGNOSIS — Z91013 Allergy to seafood: Secondary | ICD-10-CM | POA: Diagnosis not present

## 2022-02-08 DIAGNOSIS — E7800 Pure hypercholesterolemia, unspecified: Secondary | ICD-10-CM | POA: Diagnosis present

## 2022-02-08 DIAGNOSIS — E871 Hypo-osmolality and hyponatremia: Secondary | ICD-10-CM | POA: Diagnosis not present

## 2022-02-08 DIAGNOSIS — G8929 Other chronic pain: Secondary | ICD-10-CM | POA: Diagnosis present

## 2022-02-08 DIAGNOSIS — Z8249 Family history of ischemic heart disease and other diseases of the circulatory system: Secondary | ICD-10-CM | POA: Diagnosis not present

## 2022-02-08 DIAGNOSIS — M549 Dorsalgia, unspecified: Secondary | ICD-10-CM | POA: Diagnosis present

## 2022-02-08 LAB — CBC
HCT: 33.6 % — ABNORMAL LOW (ref 36.0–46.0)
HCT: 31.4 % — ABNORMAL LOW (ref 36.0–46.0)
Hemoglobin: 11.5 g/dL — ABNORMAL LOW (ref 12.0–15.0)
Hemoglobin: 11 g/dL — ABNORMAL LOW (ref 12.0–15.0)
MCH: 29.2 pg (ref 26.0–34.0)
MCH: 29.3 pg (ref 26.0–34.0)
MCHC: 34.2 g/dL (ref 30.0–36.0)
MCHC: 35 g/dL (ref 30.0–36.0)
MCV: 85.3 fL (ref 80.0–100.0)
MCV: 83.7 fL (ref 80.0–100.0)
Platelets: 305 10*3/uL (ref 150–400)
Platelets: 295 10*3/uL (ref 150–400)
RBC: 3.94 MIL/uL (ref 3.87–5.11)
RBC: 3.75 MIL/uL — ABNORMAL LOW (ref 3.87–5.11)
RDW: 12.1 % (ref 11.5–15.5)
RDW: 12.1 % (ref 11.5–15.5)
WBC: 12.1 10*3/uL — ABNORMAL HIGH (ref 4.0–10.5)
WBC: 12.2 10*3/uL — ABNORMAL HIGH (ref 4.0–10.5)
nRBC: 0 % (ref 0.0–0.2)
nRBC: 0 % (ref 0.0–0.2)

## 2022-02-08 LAB — BASIC METABOLIC PANEL WITH GFR
Anion gap: 9 (ref 5–15)
BUN: 10 mg/dL (ref 8–23)
CO2: 26 mmol/L (ref 22–32)
Calcium: 9.1 mg/dL (ref 8.9–10.3)
Chloride: 99 mmol/L (ref 98–111)
Creatinine, Ser: 0.93 mg/dL (ref 0.44–1.00)
GFR, Estimated: >60 mL/min
Glucose, Bld: 146 mg/dL — ABNORMAL HIGH (ref 70–99)
Potassium: 4 mmol/L (ref 3.5–5.1)
Sodium: 134 mmol/L — ABNORMAL LOW (ref 135–145)

## 2022-02-08 MED ORDER — HYDROXYZINE HCL 25 MG PO TABS: 25.0000 mg | ORAL_TABLET | Freq: Every evening | ORAL | Status: DC | PRN

## 2022-02-08 MED ORDER — DIPHENHYDRAMINE HCL 25 MG PO CAPS
25.0000 mg | ORAL_CAPSULE | Freq: Every evening | ORAL | Status: DC | PRN
Start: 2022-02-08 — End: 2022-02-10

## 2022-02-08 MED ORDER — MONTELUKAST SODIUM 5 MG PO CHEW
5.0000 mg | CHEWABLE_TABLET | Freq: Every day | ORAL | Status: DC
Start: 2022-02-08 — End: 2022-02-10
  Administered 2022-02-08 – 2022-02-09 (×2): 5 mg via ORAL
  Filled 2022-02-08 (×3): qty 1

## 2022-02-08 MED ORDER — MOMETASONE FURO-FORMOTEROL FUM 200-5 MCG/ACT IN AERO
2.0000 | INHALATION_SPRAY | Freq: Two times a day (BID) | RESPIRATORY_TRACT | Status: DC
  Administered 2022-02-09 (×2): 2 via RESPIRATORY_TRACT
  Filled 2022-02-08 (×2): qty 8.8

## 2022-02-08 MED ORDER — METHOCARBAMOL 500 MG PO TABS
1000.0000 mg | ORAL_TABLET | Freq: Three times a day (TID) | ORAL | Status: DC
  Administered 2022-02-08 – 2022-02-10 (×6): 1000 mg via ORAL
  Filled 2022-02-08 (×6): qty 2

## 2022-02-08 MED ORDER — DIPHENHYDRAMINE HCL 25 MG PO CAPS
25.0000 mg | ORAL_CAPSULE | Freq: Every day | ORAL | Status: DC
Start: 2022-02-08 — End: 2022-02-08

## 2022-02-08 MED ORDER — HYDROXYZINE HCL 25 MG PO TABS
25.0000 mg | ORAL_TABLET | Freq: Every day | ORAL | Status: DC
  Administered 2022-02-08 – 2022-02-09 (×2): 25 mg via ORAL
  Filled 2022-02-08 (×2): qty 1

## 2022-02-08 MED ORDER — MELATONIN 5 MG PO TABS
5.0000 mg | ORAL_TABLET | Freq: Every day | ORAL | Status: DC
  Administered 2022-02-08 – 2022-02-09 (×2): 5 mg via ORAL
  Filled 2022-02-08 (×2): qty 1

## 2022-02-08 MED ORDER — PANTOPRAZOLE SODIUM 40 MG PO TBEC
40.0000 mg | DELAYED_RELEASE_TABLET | Freq: Every day | ORAL | Status: DC
  Administered 2022-02-08 – 2022-02-10 (×3): 40 mg via ORAL
  Filled 2022-02-08 (×3): qty 1

## 2022-02-08 MED ORDER — ALBUTEROL SULFATE HFA 108 (90 BASE) MCG/ACT IN AERS: 1.0000 | INHALATION_SPRAY | RESPIRATORY_TRACT | Status: DC | PRN

## 2022-02-08 MED ORDER — OXYCODONE HCL 5 MG/5ML PO SOLN: 5.0000 mg | ORAL | Status: DC | PRN

## 2022-02-08 MED ORDER — POLYETHYLENE GLYCOL 3350 17 G PO PACK: 17.0000 g | PACK | Freq: Every day | ORAL | Status: DC | PRN

## 2022-02-08 MED ORDER — OXYCODONE HCL 5 MG PO TABS: 5.0000 mg | ORAL_TABLET | ORAL | Status: DC | PRN

## 2022-02-08 MED ORDER — ALBUTEROL SULFATE (2.5 MG/3ML) 0.083% IN NEBU: 2.5000 mg | INHALATION_SOLUTION | Freq: Four times a day (QID) | RESPIRATORY_TRACT | Status: DC | PRN

## 2022-02-08 MED ORDER — SACCHAROMYCES BOULARDII 250 MG PO CAPS
250.0000 mg | ORAL_CAPSULE | Freq: Two times a day (BID) | ORAL | Status: DC
  Administered 2022-02-08 – 2022-02-10 (×5): 250 mg via ORAL
  Filled 2022-02-08 (×5): qty 1

## 2022-02-08 MED ORDER — METHOCARBAMOL 500 MG PO TABS: 1000.0000 mg | ORAL_TABLET | Freq: Three times a day (TID) | ORAL | Status: DC | PRN

## 2022-02-08 MED ORDER — OXYCODONE HCL 5 MG PO TABS
5.0000 mg | ORAL_TABLET | ORAL | Status: DC | PRN
  Administered 2022-02-08: 5 mg via ORAL
  Administered 2022-02-09 – 2022-02-10 (×2): 10 mg via ORAL
  Filled 2022-02-08 (×2): qty 2
  Filled 2022-02-08: qty 1

## 2022-02-08 MED ORDER — ESLICARBAZEPINE ACETATE 400 MG PO TABS
1.0000 | ORAL_TABLET | Freq: Every evening | ORAL | Status: DC
Start: 2022-02-08 — End: 2022-02-10
  Administered 2022-02-08 – 2022-02-09 (×2): 400 mg via ORAL
  Filled 2022-02-08 (×2): qty 1

## 2022-02-08 MED ORDER — LACTATED RINGERS IV BOLUS
500.0000 mL | Freq: Once | INTRAVENOUS | Status: AC
Start: 2022-02-08 — End: 2022-02-08
  Administered 2022-02-08: 500 mL via INTRAVENOUS

## 2022-02-08 MED ORDER — LACOSAMIDE 50 MG PO TABS
200.0000 mg | ORAL_TABLET | Freq: Two times a day (BID) | ORAL | Status: DC
  Administered 2022-02-08 – 2022-02-10 (×5): 200 mg via ORAL
  Filled 2022-02-08 (×4): qty 4

## 2022-02-08 NOTE — Discharge Instructions (Signed)
[facility]  Please arrive at least 30 min before your appointment to complete your check in paperwork.  If you are unable to arrive 30 min prior to your appointment time we may have to cancel or reschedule you. LAPAROSCOPIC SURGERY: POST OP INSTRUCTIONS Always review your discharge instruction sheet given to you by the facility where your surgery was performed. IF YOU HAVE DISABILITY OR FAMILY LEAVE FORMS, YOU MUST BRING THEM TO THE OFFICE FOR PROCESSING.   DO NOT GIVE THEM TO YOUR DOCTOR.  PAIN CONTROL  First take acetaminophen (Tylenol) AND/or ibuprofen (Advil) to control your pain after surgery.  Follow directions on package.  Taking acetaminophen (Tylenol) and/or ibuprofen (Advil) regularly after surgery will help to control your pain and lower the amount of prescription pain medication you may need.  You should not take more than 4,000 mg (4 grams) of acetaminophen (Tylenol) in 24 hours.  You should not take ibuprofen (Advil), aleve, motrin, naprosyn or other NSAIDS if you have a history of stomach ulcers or chronic kidney disease.  A prescription for pain medication may be given to you upon discharge.  Take your pain medication as prescribed, if you still have uncontrolled pain after taking acetaminophen (Tylenol) or ibuprofen (Advil). Use ice packs to help control pain. If you need a refill on your pain medication, please contact your pharmacy.  They will contact our office to request authorization. Prescriptions will not be filled after 5pm or on week-ends.  HOME MEDICATIONS Take your usually prescribed medications unless otherwise directed.  DIET You should follow a light diet the first few days after arrival home.  Be sure to include lots of fluids daily. Avoid fatty, fried foods.   CONSTIPATION It is common to experience some constipation after surgery and if you are taking pain medication.  Increasing fluid intake and taking a stool softener (such as Colace)  will usually help or prevent this problem from occurring.  A mild laxative (Milk of Magnesia or Miralax) should be taken according to package instructions if there are no bowel movements after 48 hours.  WOUND/INCISION CARE Most patients will experience some swelling and bruising in the area of the incisions.  Ice packs will help.  Swelling and bruising can take several days to resolve.  Unless discharge instructions indicate otherwise, follow guidelines below  STERI-STRIPS - you may remove your outer bandages 48 hours after surgery, and you may shower at that time.  You have steri-strips (small skin tapes) in place directly over the incision.  These strips should be left on the skin for 7-10 days.   DERMABOND/SKIN GLUE - you may shower in 24 hours.  The glue will flake off over the next 2-3 weeks. Any sutures or staples will be removed at the office during your follow-up visit.  ACTIVITIES You may resume regular (light) daily activities beginning the next day--such as daily self-care, walking, climbing stairs--gradually increasing activities as tolerated.  You may have sexual intercourse when it is comfortable.  Refrain from any heavy lifting or straining until approved by your doctor. You may drive when you are no longer taking prescription pain medication, you can comfortably wear a seatbelt, and you can safely maneuver your car and apply brakes.  FOLLOW-UP You should see your doctor in the office for a follow-up appointment approximately 2-3 weeks after your surgery.  You should have been given your post-op/follow-up appointment when your surgery was scheduled.  If you did not receive a post-op/follow-up appointment, make sure   that you call for this appointment within a day or two after you arrive home to insure a convenient appointment time.  OTHER INSTRUCTIONS  WHEN TO CALL YOUR DOCTOR: Fever over 101.0 Inability to urinate Continued bleeding from incision. Increased pain, redness, or  drainage from the incision. Increasing abdominal pain  The clinic staff is available to answer your questions during regular business hours.  Please don't hesitate to call and ask to speak to one of the nurses for clinical concerns.  If you have a medical emergency, go to the nearest emergency room or call 911.  A surgeon from [facility] is always on call at the hospital. [address], [address], [place], [place]  [address] ? [address] [address], [place], [place]   [address] [phone] ? [phone] ? FAX [phone]   

## 2022-02-08 NOTE — Progress Notes (Signed)
Arrived to 6N. Patient transported by RN. She is A & O, talking, and walks to the bathroom with walker + assist.  

## 2022-02-08 NOTE — Progress Notes (Signed)
Pt has home med VIMPAT @bedside  and took the medication before telling me. I let her know the med is available on the floor. Pt says she wasn't aware that they had it.

## 2022-02-08 NOTE — Progress Notes (Signed)
Patient ID: Angela Giles, female   DOB: [birth year], 65 y.o.   MRN: [id number] [facility] Progress Note  1 Day Post-Op  Subjective: CC-  Feeling a little dizzy and nauseated this morning. No emesis. Says this is sometimes how she feels before she has a seizure.  Tolerated breakfast this morning. Abdomen sore but pain well controlled. Ambulated to the restroom without issues.   Objective: Vital signs in last 24 hours: Temp:  [97.5 F (36.4 C)-99 F (37.2 C)] 98.2 F (36.8 C) (07/24 0807) Pulse Rate:  [56-94] 60 (07/24 0807) Resp:  [9-20] 17 (07/24 0807) BP: (109-136)/(37-88) 123/62 (07/24 0807) SpO2:  [92 %-100 %] 97 % (07/24 0807) Weight:  [65.8 kg] 65.8 kg (07/23 1135) Last BM Date : 02/07/22  Intake/Output from previous day: 07/23 0701 - 07/24 0700 In: 1439 [I.V.:1188.2; IV Piggyback:250.8] Out: 25 [Blood:25] Intake/Output this shift: Total I/O In: 220 [P.O.:220] Out: -   PE: Gen:  Alert, NAD, pleasant Abd: soft, mild distension, appropriately tender, +BS, lap incision cdi without erythema or drainage  Lab Results:  Recent Labs    02/07/22 1143 02/08/22 0905  WBC 14.6* 12.1*  HGB 13.8 11.5*  HCT 39.4 33.6*  PLT 368 305   BMET Recent Labs    02/07/22 1143 02/08/22 0905  NA 134* 134*  K 4.2 4.0  CL 99 99  CO2 27 26  GLUCOSE 128* 146*  BUN 8 10  CREATININE 0.86 0.93  CALCIUM 9.6 9.1   PT/INR No results for input(s): "LABPROT", "INR" in the last 72 hours. CMP     Component Value Date/Time   NA 134 (L) 02/08/2022 0905   K 4.0 02/08/2022 0905   CL 99 02/08/2022 0905   CO2 26 02/08/2022 0905   GLUCOSE 146 (H) 02/08/2022 0905   BUN 10 02/08/2022 0905   CREATININE 0.93 02/08/2022 0905   CALCIUM 9.1 02/08/2022 0905   PROT 7.4 02/07/2022 1143   ALBUMIN 4.2 02/07/2022 1143   AST 17 02/07/2022 1143   ALT 10 02/07/2022 1143   ALKPHOS 91 02/07/2022 1143   BILITOT 0.6 02/07/2022 1143   GFRNONAA >60 02/08/2022 0905   Lipase      Component Value Date/Time   LIPASE 30 02/07/2022 1143       Studies/Results: CT Abdomen Pelvis W Contrast  Result Date: 02/07/2022 CLINICAL DATA:  Right lower quadrant abdominal pain. EXAM: CT ABDOMEN AND PELVIS WITH CONTRAST TECHNIQUE: Multidetector CT imaging of the abdomen and pelvis was performed using the standard protocol following bolus administration of intravenous contrast. RADIATION DOSE REDUCTION: This exam was performed according to the departmental dose-optimization program which includes automated exposure control, adjustment of the mA and/or kV according to patient size and/or use of iterative reconstruction technique. CONTRAST:  67mL OMNIPAQUE IOHEXOL 300 MG/ML  SOLN COMPARISON:  None available FINDINGS: Lower chest: Mild dependent atelectasis is present both lung bases. Heart size is normal. No significant pleural or pericardial effusion is present. Hepatobiliary: No focal liver abnormality is seen. Status post cholecystectomy. No biliary dilatation. Pancreas: Unremarkable. No pancreatic ductal dilatation or surrounding inflammatory changes. Spleen: Normal in size without focal abnormality. Adrenals/Urinary Tract: Adrenal glands are unremarkable. Kidneys are normal, without renal calculi, focal lesion, or hydronephrosis. Bladder is unremarkable. Stomach/Bowel: The stomach and duodenum are within normal limits. Proximal small bowel is unremarkable. Inflammatory changes are noted about the terminal ileum. The source of inflammation appears to be the appendix which is enlarged, measuring 12 mm in diameter. Extensive surrounding soft tissue  stranding and minimal layering fluid is noted. The ascending colon is otherwise within normal limits. Transverse colon is normal. Diverticular changes are present in the descending and sigmoid colon without focal inflammatory changes to suggest colitis. Vascular/Lymphatic: Mild atherosclerotic changes are present within the abdominal aorta. No aneurysm or  definite stenosis. No significant pelvic adenopathy is present. Reproductive: Status post hysterectomy. No adnexal masses. Other: A paraumbilical hernia contains fat without bowel. No other significant ventral hernia is present. No other free fluid or free air is present. Musculoskeletal: Slight retrolisthesis at L5-S1 is associated degenerative facet disease. Mild leftward curvature is centered at L3-4. Vertebral body heights are maintained. No focal osseous lesions are present. Degenerative changes noted at the SI joints. The hips are located and limits. IMPRESSION: 1. Acute appendicitis without evidence for rupture or abscess. 2. Inflammatory changes about the terminal ileum are secondary to the acute appendicitis 3. Descending and sigmoid diverticulosis without diverticulitis. 4. Status post cholecystectomy and hysterectomy. 5. Aortic Atherosclerosis (ICD10-I70.0). These results were called by telephone at the time of interpretation on 02/07/2022 at 2:32 pm to provider HALEY SAGE , who verbally acknowledged these results. Electronically Signed   By: Marin Roberts M.D.   On: 02/07/2022 14:32    Anti-infectives: Anti-infectives (From admission, onward)    Start     Dose/Rate Route Frequency Ordered Stop   02/07/22 1515  cefTRIAXone (ROCEPHIN) 2 g in sodium chloride 0.9 % 100 mL IVPB       See Hyperspace for full Linked Orders Report.   2 g 200 mL/hr over 30 Minutes Intravenous  Once 02/07/22 1507 02/07/22 1622   02/07/22 1515  metroNIDAZOLE (FLAGYL) IVPB 500 mg       See Hyperspace for full Linked Orders Report.   500 mg 100 mL/hr over 60 Minutes Intravenous  Once 02/07/22 1507 02/07/22 1800        Assessment/Plan gangrenous/necrotic appendix without perforation POD# s/p laparoscopic appendectomy 7/23 Dr. Bedelia Person - Hold discharge for now. Check labs. Giving home aptiom now for hx epilepsy.   ID - rocephin/flagyl 7/23 FEN - reg diet VTE - SCDs, hold lovenox pending h/h Foley -  none  COPD - home meds Epilepsy - home meds HTN - hold norvasc  Chronic back pain    LOS: 0 days    Franne Forts, [facility] 02/08/2022, 10:22 AM Please see Amion for pager number during day hours 7:00am-4:30pm

## 2022-02-09 LAB — BASIC METABOLIC PANEL
Anion gap: 6 (ref 5–15)
BUN: 10 mg/dL (ref 8–23)
CO2: 26 mmol/L (ref 22–32)
Calcium: 8.4 mg/dL — ABNORMAL LOW (ref 8.9–10.3)
Chloride: 97 mmol/L — ABNORMAL LOW (ref 98–111)
Creatinine, Ser: 0.79 mg/dL (ref 0.44–1.00)
GFR, Estimated: >60 mL/min (ref >60)
Glucose, Bld: 122 mg/dL — ABNORMAL HIGH (ref 70–99)
Potassium: 3.7 mmol/L (ref 3.5–5.1)
Sodium: 129 mmol/L — ABNORMAL LOW (ref 135–145)

## 2022-02-09 LAB — CBC
HCT: 28.5 % — ABNORMAL LOW (ref 36.0–46.0)
HCT: 31.7 % — ABNORMAL LOW (ref 36.0–46.0)
Hemoglobin: 10 g/dL — ABNORMAL LOW (ref 12.0–15.0)
Hemoglobin: 11.1 g/dL — ABNORMAL LOW (ref 12.0–15.0)
MCH: 29.4 pg (ref 26.0–34.0)
MCH: 29.6 pg (ref 26.0–34.0)
MCHC: 35.1 g/dL (ref 30.0–36.0)
MCHC: 35 g/dL (ref 30.0–36.0)
MCV: 83.8 fL (ref 80.0–100.0)
MCV: 84.5 fL (ref 80.0–100.0)
Platelets: 238 10*3/uL (ref 150–400)
Platelets: 276 10*3/uL (ref 150–400)
RBC: 3.4 MIL/uL — ABNORMAL LOW (ref 3.87–5.11)
RBC: 3.75 MIL/uL — ABNORMAL LOW (ref 3.87–5.11)
RDW: 12.2 % (ref 11.5–15.5)
RDW: 12.1 % (ref 11.5–15.5)
WBC: 8.6 10*3/uL (ref 4.0–10.5)
WBC: 8.2 10*3/uL (ref 4.0–10.5)
nRBC: 0 % (ref 0.0–0.2)
nRBC: 0 % (ref 0.0–0.2)

## 2022-02-09 LAB — SURGICAL PATHOLOGY

## 2022-02-09 MED ORDER — SODIUM CHLORIDE 1 G PO TABS
2.0000 g | ORAL_TABLET | Freq: Three times a day (TID) | ORAL | Status: DC
Start: 2022-02-09 — End: 2022-02-10
  Administered 2022-02-09 – 2022-02-10 (×3): 2 g via ORAL
  Filled 2022-02-09 (×5): qty 2

## 2022-02-09 MED ORDER — MECLIZINE HCL 12.5 MG PO TABS
12.5000 mg | ORAL_TABLET | Freq: Two times a day (BID) | ORAL | Status: DC
Start: 2022-02-09 — End: 2022-02-10
  Administered 2022-02-09: 12.5 mg via ORAL
  Filled 2022-02-09 (×3): qty 1

## 2022-02-09 NOTE — Progress Notes (Cosign Needed Addendum)
Patient ID: Angela Giles, female   DOB: 12-02-1955, 66 y.o.   MRN: [id number] [facility] Progress Note  2 Days Post-Op  Subjective: CC-  States that she felt dizzy all day yesterday and lightheaded once while sitting on the toilet. No LOC. Denies blurry vision, headache, CP, SOB. States that this has happened at home a couple times but did not last this long. These symptoms did improve yesterday evening/ last night but she feels nauseated and dizzy again this morning. No emesis. Continues to pass flatus. Loose stools have stopped.  WBC WNL, afebrile. Hgb 10 << 11 << 11.5 << 13.8  Objective: Vital signs in last 24 hours: Temp:  [97.8 F (36.6 C)-98.6 F (37 C)] 98.3 F (36.8 C) (07/25 0454) Pulse Rate:  [51-67] 51 (07/25 0827) Resp:  [17-18] 17 (07/25 0827) BP: (117-133)/(58-77) 117/58 (07/25 0454) SpO2:  [95 %-100 %] 96 % (07/25 0827) Last BM Date : 02/08/22  Intake/Output from previous day: 07/24 0701 - 07/25 0700 In: 460 [P.O.:460] Out: -  Intake/Output this shift: No intake/output data recorded.  PE: Gen:  Alert, NAD, pleasant Abd: soft, ND, appropriately tender at incisions and RLQ without rebound or guarding, +BS, lap incision cdi with some surrounding ecchymosis but no erythema or drainage  Lab Results:  Recent Labs    02/08/22 1356 02/09/22 0256  WBC 12.2* 8.6  HGB 11.0* 10.0*  HCT 31.4* 28.5*  PLT 295 238   BMET Recent Labs    02/08/22 0905 02/09/22 0256  NA 134* 129*  K 4.0 3.7  CL 99 97*  CO2 26 26  GLUCOSE 146* 122*  BUN 10 10  CREATININE 0.93 0.79  CALCIUM 9.1 8.4*   PT/INR No results for input(s): "LABPROT", "INR" in the last 72 hours. CMP     Component Value Date/Time   NA 129 (L) 02/09/2022 0256   K 3.7 02/09/2022 0256   CL 97 (L) 02/09/2022 0256   CO2 26 02/09/2022 0256   GLUCOSE 122 (H) 02/09/2022 0256   BUN 10 02/09/2022 0256   CREATININE 0.79 02/09/2022 0256   CALCIUM 8.4 (L) 02/09/2022 0256   PROT 7.4 02/07/2022  1143   ALBUMIN 4.2 02/07/2022 1143   AST 17 02/07/2022 1143   ALT 10 02/07/2022 1143   ALKPHOS 91 02/07/2022 1143   BILITOT 0.6 02/07/2022 1143   GFRNONAA >60 02/09/2022 0256   Lipase     Component Value Date/Time   LIPASE 30 02/07/2022 1143       Studies/Results: CT Abdomen Pelvis W Contrast  Result Date: 02/07/2022 CLINICAL DATA:  Right lower quadrant abdominal pain. EXAM: CT ABDOMEN AND PELVIS WITH CONTRAST TECHNIQUE: Multidetector CT imaging of the abdomen and pelvis was performed using the standard protocol following bolus administration of intravenous contrast. RADIATION DOSE REDUCTION: This exam was performed according to the departmental dose-optimization program which includes automated exposure control, adjustment of the mA and/or kV according to patient size and/or use of iterative reconstruction technique. CONTRAST:  48mL OMNIPAQUE IOHEXOL 300 MG/ML  SOLN COMPARISON:  None available FINDINGS: Lower chest: Mild dependent atelectasis is present both lung bases. Heart size is normal. No significant pleural or pericardial effusion is present. Hepatobiliary: No focal liver abnormality is seen. Status post cholecystectomy. No biliary dilatation. Pancreas: Unremarkable. No pancreatic ductal dilatation or surrounding inflammatory changes. Spleen: Normal in size without focal abnormality. Adrenals/Urinary Tract: Adrenal glands are unremarkable. Kidneys are normal, without renal calculi, focal lesion, or hydronephrosis. Bladder is unremarkable. Stomach/Bowel: The stomach and  duodenum are within normal limits. Proximal small bowel is unremarkable. Inflammatory changes are noted about the terminal ileum. The source of inflammation appears to be the appendix which is enlarged, measuring 12 mm in diameter. Extensive surrounding soft tissue stranding and minimal layering fluid is noted. The ascending colon is otherwise within normal limits. Transverse colon is normal. Diverticular changes are  present in the descending and sigmoid colon without focal inflammatory changes to suggest colitis. Vascular/Lymphatic: Mild atherosclerotic changes are present within the abdominal aorta. No aneurysm or definite stenosis. No significant pelvic adenopathy is present. Reproductive: Status post hysterectomy. No adnexal masses. Other: A paraumbilical hernia contains fat without bowel. No other significant ventral hernia is present. No other free fluid or free air is present. Musculoskeletal: Slight retrolisthesis at L5-S1 is associated degenerative facet disease. Mild leftward curvature is centered at L3-4. Vertebral body heights are maintained. No focal osseous lesions are present. Degenerative changes noted at the SI joints. The hips are located and limits. IMPRESSION: 1. Acute appendicitis without evidence for rupture or abscess. 2. Inflammatory changes about the terminal ileum are secondary to the acute appendicitis 3. Descending and sigmoid diverticulosis without diverticulitis. 4. Status post cholecystectomy and hysterectomy. 5. Aortic Atherosclerosis (ICD10-I70.0). These results were called by telephone at the time of interpretation on 02/07/2022 at 2:32 pm to provider HALEY SAGE , who verbally acknowledged these results. Electronically Signed   By: Marin Roberts M.D.   On: 02/07/2022 14:32    Anti-infectives: Anti-infectives (From admission, onward)    Start     Dose/Rate Route Frequency Ordered Stop   02/07/22 1515  cefTRIAXone (ROCEPHIN) 2 g in sodium chloride 0.9 % 100 mL IVPB       See Hyperspace for full Linked Orders Report.   2 g 200 mL/hr over 30 Minutes Intravenous  Once 02/07/22 1507 02/07/22 1622   02/07/22 1515  metroNIDAZOLE (FLAGYL) IVPB 500 mg       See Hyperspace for full Linked Orders Report.   500 mg 100 mL/hr over 60 Minutes Intravenous  Once 02/07/22 1507 02/07/22 1800        Assessment/Plan Gangrenous/necrotic appendix without perforation -POD#2 s/p laparoscopic  appendectomy 7/23 Dr. Bedelia Person - Dizziness did improve yesterday afternoon/evening but returned this morning. VSS. WBC normalized. Abdominal exam reassuring. Hgb 10 << 11 << 11.5 << 13.8.  Monitor this morning. Will try some meclizine. Encourage PO intake. Repeat CBC at 1400   ID - rocephin/flagyl 7/23 FEN - reg diet VTE - SCDs, hold lovenox with downtrending h/h Foley - none   Hyponatremia - Na 129, start salt tabs COPD - home meds Epilepsy - home meds HTN - hold norvasc  Chronic back pain    LOS: 1 day    Franne Forts, [facility] 02/09/2022, 8:46 AM Please see Amion for pager number during day hours 7:00am-4:30pm

## 2022-02-09 NOTE — Plan of Care (Signed)

## 2022-02-09 NOTE — TOC Initial Note (Addendum)
Transition of Care [facility]) - Initial/Assessment Note    Patient Details  Name: Angela Giles MRN: [id number] Date of Birth: 1956/06/14  Transition of Care [facility]) CM/SW Contact:    Kingsley Plan, RN Phone Number: 02/09/2022, 11:39 AM  Clinical Narrative:                 Talked to patient at bedside. Confirmed face sheet information.  PCP [facility]   Patient lives with her daughter, daughter's two children and daughter's boyfriend. Also lives with niece , niece's husband and their four children   Patient has rollator, shower chair and cane at home.      Transition of Care Department [facility]) has reviewed patient and no TOC needs have been identified at this time. We will continue to monitor patient advancement through interdisciplinary progression rounds. If new patient transition needs arise, please place a TOC consult.    Expected Discharge Plan: Home/Self Care Barriers to Discharge: Continued Medical Work up   Patient Goals and CMS Choice Patient states their goals for this hospitalization and ongoing recovery are:: to return to home      Expected Discharge Plan and Services Expected Discharge Plan: Home/Self Care       Living arrangements for the past 2 months: Single Family Home Expected Discharge Date: 02/08/22               DME Arranged: N/A DME Agency: NA       HH Arranged: NA          Prior Living Arrangements/Services Living arrangements for the past 2 months: Single Family Home Lives with:: Relatives Patient language and need for interpreter reviewed:: Yes Do you feel safe going back to the place where you live?: Yes      Need for Family Participation in Patient Care: Yes (Comment) Care giver support system in place?: Yes (comment) Current home services: DME Criminal Activity/Legal Involvement Pertinent to Current Situation/Hospitalization: No - Comment as needed  Activities of Daily Living      Permission Sought/Granted    Permission granted to share information with : No              Emotional Assessment Appearance:: Appears stated age Attitude/Demeanor/Rapport: Engaged Affect (typically observed): Accepting Orientation: : Oriented to Self, Oriented to Place, Oriented to  Time, Oriented to Situation Alcohol / Substance Use: Not Applicable Psych Involvement: No (comment)  Admission diagnosis:  Acute appendicitis with localized peritonitis, without perforation, abscess, or gangrene [K35.30] Acute appendicitis [K35.80] Patient Active Problem List   Diagnosis Date Noted   Acute appendicitis 02/07/2022   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 [place] [address] Phone: [phone] Fax: [phone]  [id number] - [place], [place] - [address] [place] [address] Phone: [phone] Fax: [phone]  [facility] [address] [place] [address] Phone: [phone] Fax: [phone]     Social Determinants of Health (SDOH) Interventions  Readmission Risk Interventions     No data to display

## 2022-02-10 NOTE — Progress Notes (Signed)
Discharge instructions reviewed with patient, patient verbalizes understanding. Home medication collected and returned to patient. TOC medications given to patient. Patient belongings collected. Patient discharged

## 2022-02-10 NOTE — Progress Notes (Signed)
Mobility Specialist Progress Note:   02/10/22 0915  Mobility  Activity Ambulated with assistance in hallway  Level of Assistance Standby assist, set-up cues, supervision of patient - no hands on  Assistive Device Four wheel walker  Distance Ambulated (ft) 550 ft  Activity Response Tolerated well  $Mobility charge 1 Mobility   Pt agreeable to mobility session this am. Required no physical assistance throughout. Pt back sitting EOB with all needs met.   Nelta Numbers Acute Rehab Secure Chat or Office Phone: [phone]

## 2022-02-10 NOTE — Discharge Summary (Signed)
[facility] Discharge Summary   Patient ID: Angela Giles MRN: [id number] DOB/AGE: [birth year] 66 y.o.  Admit date: 02/07/2022 Discharge date: 02/10/2022  Discharge Diagnosis: Acute appendicitis Dizziness  Consultants None  Imaging: No results found.  Procedures Dr. Bedelia Person (02/07/2022) - Laparoscopic appendectomy  Hospital Course:  Angela Giles is a 66 y.o. female PMH epilepsy, COPD, HTN, chronic back pain who presented to [facility] 7/23 with acute onset abdominal pain.  Workup showed acute appendicits.  Patient was admitted and underwent procedure listed above.  Tolerated procedure well and was transferred to the floor.  Diet was advanced as tolerated.  Patient did experience some intermittent dizziness postoperatively. This was likely multifactorial but did resolve on postop day #3. On POD3, the patient was voiding well, tolerating diet, ambulating well, pain well controlled, vital signs stable, incisions c/d/i and felt stable for discharge home.  Patient will follow up as below and knows to call with questions or concerns.      Physical Exam: Gen:  Alert, NAD, pleasant Abd: soft, ND, nontender, +BS, lap incision cdi with some surrounding ecchymosis but no erythema or drainage   Allergies as of 02/10/2022       Reactions   Beef-derived Products    Fish Allergy    Milk-related Compounds         Medication List     TAKE these medications    Acetaminophen Extra Strength 500 MG tablet Generic drug: acetaminophen Take 2 tablets (1,000 mg total) by mouth every 6 (six) hours as needed.   albuterol (2.5 MG/3ML) 0.083% nebulizer solution Commonly known as: PROVENTIL Take 3 mLs (2.5 mg total) by nebulization every 6 (six) hours as needed for wheezing or shortness of breath.   albuterol 108 (90 Base) MCG/ACT inhaler Commonly known as: VENTOLIN HFA Inhale 1-2 puffs into the lungs every 4 (four) hours as needed for shortness of breath or wheezing.   Aptiom 400  MG Tabs Generic drug: Eslicarbazepine Acetate Take 1 tablet (400 mg total) by mouth daily. What changed:  how much to take when to take this   aspirin EC 81 MG tablet Take 81 mg by mouth daily. Swallow whole.   diphenhydrAMINE 25 MG tablet Commonly known as: BENADRYL Take 25 mg by mouth at bedtime.   docusate sodium 100 MG capsule Commonly known as: Colace Take 1 capsule (100 mg total) by mouth 2 (two) times daily.   hydrOXYzine 25 MG tablet Commonly known as: ATARAX Take 1 tablet (25 mg total) by mouth at bedtime as needed. What changed: reasons to take this   ibuprofen 600 MG tablet Commonly known as: ADVIL Take 1 tablet (600 mg total) by mouth 4 (four) times daily. What changed:  when to take this reasons to take this   lacosamide 200 MG Tabs tablet Commonly known as: Vimpat Take 1 tablet (200 mg total) by mouth 2 (two) times daily.   losartan 50 MG tablet Commonly known as: COZAAR Take 1 tablet (50 mg total) by mouth daily.   Melatonin 5 MG Caps Take 5 mg by mouth at bedtime.   methocarbamol 750 MG tablet Commonly known as: Robaxin-750 Take 1 tablet (750 mg total) by mouth 4 (four) times daily. What changed:  medication strength how much to take when to take this reasons to take this   montelukast 5 MG chewable tablet Commonly known as: SINGULAIR Chew 5 mg by mouth at bedtime.   omeprazole 20 MG capsule Commonly known as: PRILOSEC Take 20 mg by  mouth daily.   ondansetron 4 MG tablet Commonly known as: ZOFRAN Take 1 tablet (4 mg total) by mouth every 6 (six) hours as needed for nausea or vomiting.   oxyCODONE 5 MG immediate release tablet Commonly known as: Roxicodone Take 1 tablet (5 mg total) by mouth every 4 (four) hours as needed for severe pain.   polyethylene glycol 17 g packet Commonly known as: MiraLax Take 17 g by mouth daily. What changed:  when to take this reasons to take this   potassium chloride SA 20 MEQ tablet Commonly known  as: KLOR-CON M Take 2 tablets (40 mEq total) by mouth daily for 3 days.   simvastatin 20 MG tablet Commonly known as: ZOCOR Take 1 tablet (20 mg total) by mouth at bedtime.   Symbicort 160-4.5 MCG/ACT inhaler Generic drug: budesonide-formoterol Inhale 2 puffs into the lungs in the morning and at bedtime.          Follow-up Information     [facility], [place]. Go on 03/02/2022.   Specialty: General Surgery Why: Your appointment is 8/15 at 9:15am Arrive early to check in, fill out paperwork, Bring photo ID and insurance information Contact information: [address] [place] [address] [phone]        Georgina Quint, MD. Schedule an appointment as soon as possible for a visit.   Specialty: Internal Medicine Why: Call to arrange post-hospitalization follow up. Discuss dizziness Contact information: [address] [place] [address] [phone]                  Signed: Franne Forts, [facility] 02/10/2022, 8:52 AM Please see Amion for pager number during day hours 7:00am-4:30pm

## 2022-02-18 ENCOUNTER — Encounter: Payer: Self-pay | Admitting: Emergency Medicine

## 2022-02-18 ENCOUNTER — Ambulatory Visit (INDEPENDENT_AMBULATORY_CARE_PROVIDER_SITE_OTHER): Payer: Medicare Other | Admitting: Emergency Medicine

## 2022-02-18 ENCOUNTER — Telehealth: Payer: Self-pay | Admitting: Emergency Medicine

## 2022-02-18 VITALS — BP 122/80 | HR 60 | Temp 97.9°F | Ht 61.0 in | Wt 148.1 lb

## 2022-02-18 DIAGNOSIS — E7800 Pure hypercholesterolemia, unspecified: Secondary | ICD-10-CM | POA: Diagnosis not present

## 2022-02-18 DIAGNOSIS — J449 Chronic obstructive pulmonary disease, unspecified: Secondary | ICD-10-CM | POA: Diagnosis not present

## 2022-02-18 DIAGNOSIS — G894 Chronic pain syndrome: Secondary | ICD-10-CM

## 2022-02-18 DIAGNOSIS — I10 Essential (primary) hypertension: Secondary | ICD-10-CM | POA: Diagnosis not present

## 2022-02-18 DIAGNOSIS — Z09 Encounter for follow-up examination after completed treatment for conditions other than malignant neoplasm: Secondary | ICD-10-CM

## 2022-02-18 DIAGNOSIS — Z1211 Encounter for screening for malignant neoplasm of colon: Secondary | ICD-10-CM

## 2022-02-18 LAB — COMPREHENSIVE METABOLIC PANEL
ALT: 11 U/L (ref 0–35)
AST: 16 U/L (ref 0–37)
Albumin: 4.5 g/dL (ref 3.5–5.2)
Alkaline Phosphatase: 92 U/L (ref 39–117)
BUN: 10 mg/dL (ref 6–23)
CO2: 28 mEq/L (ref 19–32)
Calcium: 9.5 mg/dL (ref 8.4–10.5)
Chloride: 95 mEq/L — ABNORMAL LOW (ref 96–112)
Creatinine, Ser: 0.85 mg/dL (ref 0.40–1.20)
GFR: 72.54 mL/min (ref >60.00)
Glucose, Bld: 105 mg/dL — ABNORMAL HIGH (ref 70–99)
Potassium: 4.3 mEq/L (ref 3.5–5.1)
Sodium: 129 mEq/L — ABNORMAL LOW (ref 135–145)
Total Bilirubin: 0.3 mg/dL (ref 0.2–1.2)
Total Protein: 7.3 g/dL (ref 6.0–8.3)

## 2022-02-18 LAB — CBC WITH DIFFERENTIAL/PLATELET
Basophils Absolute: 0.1 10*3/uL (ref 0.0–0.1)
Basophils Relative: 0.8 % (ref 0.0–3.0)
Eosinophils Absolute: 0.3 10*3/uL (ref 0.0–0.7)
Eosinophils Relative: 3.6 % (ref 0.0–5.0)
HCT: 33.2 % — ABNORMAL LOW (ref 36.0–46.0)
Hemoglobin: 11.7 g/dL — ABNORMAL LOW (ref 12.0–15.0)
Lymphocytes Relative: 24.5 % (ref 12.0–46.0)
Lymphs Abs: 2 10*3/uL (ref 0.7–4.0)
MCHC: 35.1 g/dL (ref 30.0–36.0)
MCV: 83.2 fl (ref 78.0–100.0)
Monocytes Absolute: 0.6 10*3/uL (ref 0.1–1.0)
Monocytes Relative: 6.6 % (ref 3.0–12.0)
Neutro Abs: 5.4 10*3/uL (ref 1.4–7.7)
Neutrophils Relative %: 64.5 % (ref 43.0–77.0)
Platelets: 364 10*3/uL (ref 150.0–400.0)
RBC: 3.99 Mil/uL (ref 3.87–5.11)
RDW: 12.6 % (ref 11.5–15.5)
WBC: 8.3 10*3/uL (ref 4.0–10.5)

## 2022-02-18 MED ORDER — MONTELUKAST SODIUM 5 MG PO CHEW: 5.0000 mg | CHEWABLE_TABLET | Freq: Every day | ORAL | 1 refills | Status: DC

## 2022-02-18 NOTE — Telephone Encounter (Signed)
Patient needs a refill on Singulair - please send to [facility]  [address], [place]  Patient was here today but forgot to ask for a refill.

## 2022-02-18 NOTE — Telephone Encounter (Signed)
Prescription sent to pharmacy of record.  Thanks.

## 2022-02-18 NOTE — Assessment & Plan Note (Signed)
Well-controlled hypertension. BP Readings from Last 3 Encounters:  02/18/22 122/80  02/10/22 138/67  12/28/21 100/66  Continue losartan 50 mg daily.

## 2022-02-18 NOTE — Progress Notes (Signed)
Angela Giles 66 y.o.   Chief Complaint  Patient presents with   Hospitalization Follow-up    HISTORY OF PRESENT ILLNESS: This is a 66 y.o. female here for hospital discharge follow-up. Admitted on 02/07/2022 with diagnosis of acute appendicitis and discharged on 02/11/2022. Did well, no complications.  Presented initially with lightheadedness and abdominal pain. No longer feels lightheaded.  Able to eat and drink.  Having bowel movements.  No complaints today. This discharge summary assessment and plan as follows: [facility] Discharge Summary    Patient ID: Angela Giles MRN: [id number] DOB/AGE: 07-01-1956 66 y.o.   Admit date: 02/07/2022 Discharge date: 02/10/2022   Discharge Diagnosis: Acute appendicitis Dizziness   Consultants None   Imaging: Imaging Results (Last 48 hours)  No results found.     Procedures Dr. Bedelia Person (02/07/2022) - Laparoscopic appendectomy   Hospital Course:  Angela Giles is a 66 y.o. female PMH epilepsy, COPD, HTN, chronic back pain who presented to [facility] 7/23 with acute onset abdominal pain.  Workup showed acute appendicits.  Patient was admitted and underwent procedure listed above.  Tolerated procedure well and was transferred to the floor.  Diet was advanced as tolerated.  Patient did experience some intermittent dizziness postoperatively. This was likely multifactorial but did resolve on postop day #3. On POD3, the patient was voiding well, tolerating diet, ambulating well, pain well controlled, vital signs stable, incisions c/d/i and felt stable for discharge home.  Patient will follow up as below and knows to call with questions or concerns.      HPI   Prior to Admission medications   Medication Sig Start Date End Date Taking? Authorizing Provider  acetaminophen (TYLENOL) 500 MG tablet Take 2 tablets (1,000 mg total) by mouth every 6 (six) hours as needed. 02/07/22 02/07/23 Yes Diamantina Monks, MD  albuterol (PROVENTIL) (2.5  MG/3ML) 0.083% nebulizer solution Take 3 mLs (2.5 mg total) by nebulization every 6 (six) hours as needed for wheezing or shortness of breath. 10/09/21  Yes Curatolo, Adam, DO  albuterol (VENTOLIN HFA) 108 (90 Base) MCG/ACT inhaler Inhale 1-2 puffs into the lungs every 4 (four) hours as needed for shortness of breath or wheezing. 12/08/21  Yes Arlana Canizales, Eilleen Kempf, MD  APTIOM 400 MG TABS Take 1 tablet (400 mg total) by mouth daily. Patient taking differently: Take 1 tablet by mouth every evening. 12/22/21  Yes Van Clines, MD  aspirin 81 MG EC tablet Take 81 mg by mouth daily. Swallow whole.   Yes [provider]  diphenhydrAMINE (BENADRYL) 25 MG tablet Take 25 mg by mouth at bedtime.   Yes [provider]  docusate sodium (COLACE) 100 MG capsule Take 1 capsule (100 mg total) by mouth 2 (two) times daily. 02/07/22 02/07/23 Yes Lovick, Lennie Odor, MD  hydrOXYzine (ATARAX) 25 MG tablet Take 1 tablet (25 mg total) by mouth at bedtime as needed. Patient taking differently: Take 25 mg by mouth at bedtime as needed for anxiety or itching. 02/02/22  Yes Zilda No, Eilleen Kempf, MD  ibuprofen (ADVIL) 600 MG tablet Take 1 tablet (600 mg total) by mouth 4 (four) times daily. 02/07/22  Yes Diamantina Monks, MD  lacosamide (VIMPAT) 200 MG TABS tablet Take 1 tablet (200 mg total) by mouth 2 (two) times daily. 12/22/21  Yes Van Clines, MD  losartan (COZAAR) 50 MG tablet Take 1 tablet (50 mg total) by mouth daily. 10/19/21  Yes Ji Feldner, Eilleen Kempf, MD  Melatonin 5 MG CAPS Take  5 mg by mouth at bedtime.   Yes [provider]  methocarbamol (ROBAXIN-750) 750 MG tablet Take 1 tablet (750 mg total) by mouth 4 (four) times daily. 02/07/22  Yes Lovick, Lennie OdorAyesha N, MD  montelukast (SINGULAIR) 5 MG chewable tablet Chew 5 mg by mouth at bedtime. 03/19/21  Yes [provider]  omeprazole (PRILOSEC) 20 MG capsule Take 20 mg by mouth daily.   Yes [provider]  ondansetron (ZOFRAN) 4 MG  tablet Take 1 tablet (4 mg total) by mouth every 6 (six) hours as needed for nausea or vomiting. 12/17/21  Yes Plunkett, Alphonzo LemmingsWhitney, MD  oxyCODONE (ROXICODONE) 5 MG immediate release tablet Take 1 tablet (5 mg total) by mouth every 4 (four) hours as needed for severe pain. 02/07/22  Yes Lovick, Lennie OdorAyesha N, MD  polyethylene glycol (MIRALAX) 17 g packet Take 17 g by mouth daily. Patient taking differently: Take 17 g by mouth daily as needed for moderate constipation. 10/12/21  Yes Burnadette PopAdhikari, Amrit, MD  simvastatin (ZOCOR) 20 MG tablet Take 1 tablet (20 mg total) by mouth at bedtime. 02/01/22  Yes Macguire Holsinger, Eilleen KempfMiguel Jose, MD  SYMBICORT 160-4.5 MCG/ACT inhaler Inhale 2 puffs into the lungs in the morning and at bedtime. 09/24/21  Yes [provider]  potassium chloride SA (KLOR-CON M) 20 MEQ tablet Take 2 tablets (40 mEq total) by mouth daily for 3 days. 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/19/2021   COPD with acute exacerbation (HCC) 10/10/2021   High cholesterol    GERD (gastroesophageal reflux disease)    Chronic pain syndrome 09/28/2021   Essential hypertension 09/28/2021   Chronic obstructive pulmonary disease (HCC) 09/28/2021   History of seizure disorder 09/28/2021   Insomnia 09/28/2021   Chronic bilateral low back pain without sciatica 09/28/2021    Past Medical History:  Diagnosis Date   Chronic back pain    COPD (chronic obstructive pulmonary disease) (HCC)    Dorsalgia    High cholesterol    Hypertension    Seizures (HCC)     Past Surgical History:  Procedure Laterality Date   ABDOMINAL HYSTERECTOMY     CHOLECYSTECTOMY     LAPAROSCOPIC APPENDECTOMY N/A 02/07/2022   Procedure: APPENDECTOMY LAPAROSCOPIC;  Surgeon: Diamantina MonksLovick, Ayesha N,  MD;  Location: [facility] OR;  Service: General;  Laterality: N/A;    Social History   Socioeconomic History   Marital status: Divorced    Spouse name: Not on file   Number of children: 3   Years of education: Not on file   Highest education level: Not on file  Occupational History   Not on file  Tobacco Use   Smoking status: Former   Smokeless tobacco: Never  Vaping Use   Vaping Use: Never used  Substance and Sexual Activity   Alcohol use: Yes    Comment: occasionally   Drug use: Never   Sexual activity: Not on file  Other Topics Concern   Not on file  Social History Narrative   Lives in a one story home with a basement    Right handed    One soda of caffeine daily   Social Determinants of Health   Financial Resource Strain: Not on file  Food Insecurity: Not  on file  Transportation Needs: Not on file  Physical Activity: Not on file  Stress: Not on file  Social Connections: Not on file  Intimate Partner Violence: Not on file    Family History  Problem Relation Age of Onset   Dementia Mother    Heart attack Father    Diabetes Father    Lupus Sister      Review of Systems  Constitutional: Negative.  Negative for chills and fever.  HENT: Negative.  Negative for congestion and sore throat.   Respiratory: Negative.  Negative for cough and shortness of breath.   Cardiovascular: Negative.  Negative for chest pain and palpitations.  Gastrointestinal:  Negative for abdominal pain, diarrhea, nausea and vomiting.  Genitourinary: Negative.   Skin: Negative.  Negative for rash.  Neurological:  Negative for dizziness and headaches.  All other systems reviewed and are negative.   Today's Vitals   02/18/22 1052  BP: 122/80  Pulse: 60  Temp: 97.9 F (36.6 C)  TempSrc: Oral  SpO2: 96%  Weight: 148 lb 2 oz (67.2 kg)  Height: 5\' 1"  (1.549 m)   Body mass index is 27.99 kg/m. Wt Readings from Last 3 Encounters:  02/18/22 148 lb 2 oz (67.2 kg)  02/07/22 145 lb (65.8 kg)   12/28/21 142 lb 4 oz (64.5 kg)    Physical Exam Vitals reviewed.  Constitutional:      Appearance: Normal appearance.  HENT:     Head: Normocephalic.     Mouth/Throat:     Mouth: Mucous membranes are moist.     Pharynx: Oropharynx is clear.  Eyes:     Extraocular Movements: Extraocular movements intact.     Conjunctiva/sclera: Conjunctivae normal.     Pupils: Pupils are equal, round, and reactive to light.  Cardiovascular:     Rate and Rhythm: Normal rate and regular rhythm.     Pulses: Normal pulses.     Heart sounds: Normal heart sounds.  Pulmonary:     Effort: Pulmonary effort is normal.     Breath sounds: Normal breath sounds.  Abdominal:     Palpations: Abdomen is soft.     Tenderness: There is no abdominal tenderness.  Musculoskeletal:     Cervical back: No tenderness.     Right lower leg: No edema.     Left lower leg: No edema.  Lymphadenopathy:     Cervical: No cervical adenopathy.  Skin:    General: Skin is warm and dry.     Capillary Refill: Capillary refill takes less than 2 seconds.  Neurological:     General: No focal deficit present.     Mental Status: She is alert and oriented to person, place, and time.  Psychiatric:        Mood and Affect: Mood normal.        Behavior: Behavior normal.      ASSESSMENT & PLAN: A total of 48 minutes was spent with the patient and counseling/coordination of care regarding preparing for this visit, review of most recent office visit notes, review of most recent hospital discharge summary, review of multiple chronic medical problems and their management, review of all medications, review of most recent blood work results, prognosis, education on nutrition, documentation, and need for follow-up.  Problem List Items Addressed This Visit       Cardiovascular and Mediastinum   Essential hypertension    Well-controlled hypertension. BP Readings from Last 3 Encounters:  02/18/22 122/80  02/10/22 138/67  12/28/21 100/66  Continue losartan 50 mg daily.       Relevant Orders   CBC with Differential/Platelet   Comprehensive metabolic panel     Respiratory   Chronic obstructive pulmonary disease (HCC)    Stable and well-controlled.  Continue daily Symbicort and albuterol inhaler as needed        Other   Chronic pain syndrome    Stable and well-controlled.      High cholesterol    Stable.  Continue simvastatin 20 mg daily. Diet and nutrition discussed. Cardiovascular risk associated with dyslipidemia discussed.      Other Visit Diagnoses     Hospital discharge follow-up    -  Primary   Colon cancer screening       Relevant Orders   Cologuard        Patient Instructions  Health Maintenance, Female Adopting a healthy lifestyle and getting preventive care are important in promoting health and wellness. Ask your health care provider about: The right schedule for you to have regular tests and exams. Things you can do on your own to prevent diseases and keep yourself healthy. What should I know about diet, weight, and exercise? Eat a healthy diet  Eat a diet that includes plenty of vegetables, fruits, low-fat dairy products, and lean protein. Do not eat a lot of foods that are high in solid fats, added sugars, or sodium. Maintain a healthy weight Body mass index (BMI) is used to identify weight problems. It estimates body fat based on height and weight. Your health care provider can help determine your BMI and help you achieve or maintain a healthy weight. Get regular exercise Get regular exercise. This is one of the most important things you can do for your health. Most adults should: Exercise for at least 150 minutes each week. The exercise should increase your heart rate and make you sweat (moderate-intensity exercise). Do strengthening exercises at least twice a week. This is in addition to the moderate-intensity exercise. Spend less time sitting. Even light physical activity can be  beneficial. Watch cholesterol and blood lipids Have your blood tested for lipids and cholesterol at 64 years of age, then have this test every 5 years. Have your cholesterol levels checked more often if: Your lipid or cholesterol levels are high. You are older than 64 years of age. You are at high risk for heart disease. What should I know about cancer screening? Depending on your health history and family history, you may need to have cancer screening at various ages. This may include screening for: Breast cancer. Cervical cancer. Colorectal cancer. Skin cancer. Lung cancer. What should I know about heart disease, diabetes, and high blood pressure? Blood pressure and heart disease High blood pressure causes heart disease and increases the risk of stroke. This is more likely to develop in people who have high blood pressure readings or are overweight. Have your blood pressure checked: Every 3-5 years if you are 46-25 years of age. Every year if you are 25 years old or older. Diabetes Have regular diabetes screenings. This checks your fasting blood sugar level. Have the screening done: Once every three years after age 43 if you are at a normal weight and have a low risk for diabetes. More often and at a younger age if you are overweight or have a high risk for diabetes. What should I know about preventing infection? Hepatitis B If you have a higher risk for hepatitis B, you should be screened for this virus. Talk with  your health care provider to find out if you are at risk for hepatitis B infection. Hepatitis C Testing is recommended for: Everyone born from [birth year] through 1963. Anyone with known risk factors for hepatitis C. Sexually transmitted infections (STIs) Get screened for STIs, including gonorrhea and chlamydia, if: You are sexually active and are younger than 64 years of age. You are older than 64 years of age and your health care provider tells you that you are at risk for  this type of infection. Your sexual activity has changed since you were last screened, and you are at increased risk for chlamydia or gonorrhea. Ask your health care provider if you are at risk. Ask your health care provider about whether you are at high risk for HIV. Your health care provider may recommend a prescription medicine to help prevent HIV infection. If you choose to take medicine to prevent HIV, you should first get tested for HIV. You should then be tested every 3 months for as long as you are taking the medicine. Pregnancy If you are about to stop having your period (premenopausal) and you may become pregnant, seek counseling before you get pregnant. Take 400 to 800 micrograms (mcg) of folic acid every day if you become pregnant. Ask for birth control (contraception) if you want to prevent pregnancy. Osteoporosis and menopause Osteoporosis is a disease in which the bones lose minerals and strength with aging. This can result in bone fractures. If you are 33 years old or older, or if you are at risk for osteoporosis and fractures, ask your health care provider if you should: Be screened for bone loss. Take a calcium or vitamin D supplement to lower your risk of fractures. Be given hormone replacement therapy (HRT) to treat symptoms of menopause. Follow these instructions at home: Alcohol use Do not drink alcohol if: Your health care provider tells you not to drink. You are pregnant, may be pregnant, or are planning to become pregnant. If you drink alcohol: Limit how much you have to: 0-1 drink a day. Know how much alcohol is in your drink. In the [place] one drink equals one 12 oz bottle of beer (355 mL), one 5 oz glass of wine (148 mL), or one 1 oz glass of hard liquor (44 mL). Lifestyle Do not use any products that contain nicotine or tobacco. These products include cigarettes, chewing tobacco, and vaping devices, such as e-cigarettes. If you need help quitting, ask your health  care provider. Do not use street drugs. Do not share needles. Ask your health care provider for help if you need support or information about quitting drugs. General instructions Schedule regular health, dental, and eye exams. Stay current with your vaccines. Tell your health care provider if: You often feel depressed. You have ever been abused or do not feel safe at home. Summary Adopting a healthy lifestyle and getting preventive care are important in promoting health and wellness. Follow your health care provider's instructions about healthy diet, exercising, and getting tested or screened for diseases. Follow your health care provider's instructions on monitoring your cholesterol and blood pressure. This information is not intended to replace advice given to you by your health care provider. Make sure you discuss any questions you have with your health care provider. Document Revised: 11/24/2020 Document Reviewed: 11/24/2020 Elsevier Patient Education  2023 Elsevier Inc.     Edwina Barth, MD [facility]

## 2022-02-18 NOTE — Patient Instructions (Signed)

## 2022-02-18 NOTE — Telephone Encounter (Signed)
Patient requesting refill of singular. Original rx prescribed by another provider.

## 2022-02-18 NOTE — Assessment & Plan Note (Signed)
Stable and well controlled. 

## 2022-02-18 NOTE — Assessment & Plan Note (Signed)
Stable.  Continue simvastatin 20 mg daily. Diet and nutrition discussed. Cardiovascular risk associated with dyslipidemia discussed.

## 2022-02-18 NOTE — Telephone Encounter (Signed)
Notified pt with MD response../[email or web address] 

## 2022-02-18 NOTE — Assessment & Plan Note (Signed)
Stable and well-controlled.  Continue daily Symbicort and albuterol inhaler as needed

## 2022-02-19 ENCOUNTER — Telehealth: Payer: Self-pay | Admitting: Emergency Medicine

## 2022-02-19 NOTE — Telephone Encounter (Signed)
Pt is requesting a call to discuss her lab results from 02/18/22.  Please advise

## 2022-02-19 NOTE — Telephone Encounter (Signed)
Called patient and informed her of her lab results 

## 2022-02-19 NOTE — Telephone Encounter (Signed)
Pt missed call to discuss labs and is requesting another call.   Please advise.

## 2022-03-03 LAB — COLOGUARD

## 2022-03-23 ENCOUNTER — Emergency Department (HOSPITAL_COMMUNITY)
Admission: EM | Admit: 2022-03-23 | Discharge: 2022-03-23 | Disposition: A | Discharge status: Home / Self Care | Payer: Medicare Other | Attending: Emergency Medicine | Admitting: Emergency Medicine

## 2022-03-23 ENCOUNTER — Encounter (HOSPITAL_COMMUNITY): Payer: Self-pay | Admitting: Emergency Medicine

## 2022-03-23 ENCOUNTER — Telehealth: Payer: Self-pay | Admitting: Neurology

## 2022-03-23 ENCOUNTER — Other Ambulatory Visit: Payer: Self-pay

## 2022-03-23 ENCOUNTER — Emergency Department (HOSPITAL_COMMUNITY): Payer: Medicare Other

## 2022-03-23 DIAGNOSIS — Z7982 Long term (current) use of aspirin: Secondary | ICD-10-CM | POA: Insufficient documentation

## 2022-03-23 DIAGNOSIS — S0990XA Unspecified injury of head, initial encounter: Secondary | ICD-10-CM

## 2022-03-23 DIAGNOSIS — W01198A Fall on same level from slipping, tripping and stumbling with subsequent striking against other object, initial encounter: Secondary | ICD-10-CM | POA: Insufficient documentation

## 2022-03-23 DIAGNOSIS — S0003XA Contusion of scalp, initial encounter: Secondary | ICD-10-CM | POA: Insufficient documentation

## 2022-03-23 NOTE — ED Triage Notes (Signed)
Pt here fall  03/04/22 at home, hitting back of head on table corner; No LOC, No blood thinners, denies N/V, pt noted dried blood following morning. Increased pain when touched. Hx Epilepsy; taking medications as prescribed.

## 2022-03-23 NOTE — Discharge Instructions (Signed)
Please read and follow all provided instructions.  Your diagnoses today include:  1. Minor head injury, initial encounter   2. Hematoma of occipital region of scalp     Tests performed today include: CT scan of your head and neck that did not show any serious injury, but does show hematoma on the back of the head. No bruising or bleeding inside of the brain.  Vital signs. See below for your results today.   Medications prescribed:  None  Take any prescribed medications only as directed.  Home care instructions:  Follow any educational materials contained in this packet.  BE VERY CAREFUL not to take multiple medicines containing Tylenol (also called acetaminophen). Doing so can lead to an overdose which can damage your liver and cause liver failure and possibly death.   Follow-up instructions: Please follow-up with your primary care provider in the next 7 days for further evaluation of your symptoms if not improving.  Return instructions:  SEEK IMMEDIATE MEDICAL ATTENTION IF: There is confusion or drowsiness (although children frequently become drowsy after injury).  You cannot awaken the injured person.  You have more than one episode of vomiting.  You notice dizziness or unsteadiness which is getting worse, or inability to walk.  You have convulsions or unconsciousness.  You experience severe, persistent headaches not relieved by Tylenol. You cannot use arms or legs normally.  There are changes in pupil sizes. (This is the black center in the colored part of the eye)  There is clear or bloody discharge from the nose or ears.  You have change in speech, vision, swallowing, or understanding.  Localized weakness, numbness, tingling, or change in bowel or bladder control. You have any other emergent concerns.  Additional Information: You have had a head injury which does not appear to require admission at this time.  Your vital signs today were: BP 137/80 (BP Location: Left  Arm)   Pulse 78   Temp 98.7 F (37.1 C) (Oral)   Resp 17   Ht 5' (1.524 m)   Wt 65.3 kg   SpO2 95%   BMI 28.12 kg/m  If your blood pressure (BP) was elevated above 135/85 this visit, please have this repeated by your doctor within one month. --------------

## 2022-03-23 NOTE — ED Provider Notes (Signed)
[facility] EMERGENCY DEPARTMENT Provider Note   CSN: [id number] Arrival date & time: 03/23/22  1129     History  Chief Complaint  Patient presents with   Angela Giles is a 66 y.o. female.  Patient with history of epilepsy presents to the emergency department today for evaluation of head injury.  Patient states that she was stepping on a mattress to close a window blind approximately a week and a half ago.  When she stepped down that she stumbled and struck the back of her head on a piece of furniture.  She noted some dried blood on her scalp next day.  Patient has had pain and swelling in that area since the incident.  She continues to have pain, prompting emergency department visit today.  Seizures are well controlled and no seizure at time of incident, or since then.  She is compliant with her antiepileptics.  She denies weakness in the arms of the legs.  She is ambulatory at baseline.  No vision change or vision loss.  She is not on anticoagulation.       Home Medications Prior to Admission medications   Medication Sig Start Date End Date Taking? Authorizing Provider  acetaminophen (TYLENOL) 500 MG tablet Take 2 tablets (1,000 mg total) by mouth every 6 (six) hours as needed. 02/07/22 02/07/23  Diamantina Monks, MD  albuterol (PROVENTIL) (2.5 MG/3ML) 0.083% nebulizer solution Take 3 mLs (2.5 mg total) by nebulization every 6 (six) hours as needed for wheezing or shortness of breath. 10/09/21   Curatolo, Adam, DO  albuterol (VENTOLIN HFA) 108 (90 Base) MCG/ACT inhaler Inhale 1-2 puffs into the lungs every 4 (four) hours as needed for shortness of breath or wheezing. 12/08/21   Georgina Quint, MD  APTIOM 400 MG TABS Take 1 tablet (400 mg total) by mouth daily. Patient taking differently: Take 1 tablet by mouth every evening. 12/22/21   Van Clines, MD  aspirin 81 MG EC tablet Take 81 mg by mouth daily. Swallow whole.    [provider]   diphenhydrAMINE (BENADRYL) 25 MG tablet Take 25 mg by mouth at bedtime.    [provider]  docusate sodium (COLACE) 100 MG capsule Take 1 capsule (100 mg total) by mouth 2 (two) times daily. 02/07/22 02/07/23  Diamantina Monks, MD  hydrOXYzine (ATARAX) 25 MG tablet Take 1 tablet (25 mg total) by mouth at bedtime as needed. Patient taking differently: Take 25 mg by mouth at bedtime as needed for anxiety or itching. 02/02/22   Georgina Quint, MD  ibuprofen (ADVIL) 600 MG tablet Take 1 tablet (600 mg total) by mouth 4 (four) times daily. 02/07/22   Diamantina Monks, MD  lacosamide (VIMPAT) 200 MG TABS tablet Take 1 tablet (200 mg total) by mouth 2 (two) times daily. 12/22/21   Van Clines, MD  losartan (COZAAR) 50 MG tablet Take 1 tablet (50 mg total) by mouth daily. 10/19/21   Georgina Quint, MD  Melatonin 5 MG CAPS Take 5 mg by mouth at bedtime.    [provider]  methocarbamol (ROBAXIN-750) 750 MG tablet Take 1 tablet (750 mg total) by mouth 4 (four) times daily. 02/07/22   Diamantina Monks, MD  montelukast (SINGULAIR) 5 MG chewable tablet Chew 1 tablet (5 mg total) by mouth at bedtime. 02/18/22   Georgina Quint, MD  omeprazole (PRILOSEC) 20 MG capsule Take 20 mg by mouth daily.  [provider]  ondansetron (ZOFRAN) 4 MG tablet Take 1 tablet (4 mg total) by mouth every 6 (six) hours as needed for nausea or vomiting. 12/17/21   Blanchie Dessert, MD  oxyCODONE (ROXICODONE) 5 MG immediate release tablet Take 1 tablet (5 mg total) by mouth every 4 (four) hours as needed for severe pain. 02/07/22   Jesusita Oka, MD  polyethylene glycol (MIRALAX) 17 g packet Take 17 g by mouth daily. Patient taking differently: Take 17 g by mouth daily as needed for moderate constipation. 10/12/21   Shelly Coss, MD  potassium chloride SA (KLOR-CON M) 20 MEQ tablet Take 2 tablets (40 mEq total) by mouth daily for 3 days. 10/13/21 12/22/21  Shelly Coss, MD  simvastatin  (ZOCOR) 20 MG tablet Take 1 tablet (20 mg total) by mouth at bedtime. 02/01/22   Horald Pollen, MD  SYMBICORT 160-4.5 MCG/ACT inhaler Inhale 2 puffs into the lungs in the morning and at bedtime. 09/24/21   [provider]      Allergies    Beef-derived products, Fish allergy, and Milk-related compounds    Review of Systems   Review of Systems  Physical Exam Updated Vital Signs BP 137/80 (BP Location: Left Arm)   Pulse 78   Temp 98.7 F (37.1 C) (Oral)   Resp 17   Ht 5' (1.524 m)   Wt 65.3 kg   SpO2 95%   BMI 28.12 kg/m  Physical Exam Vitals and nursing note reviewed.  Constitutional:      Appearance: She is well-developed.  HENT:     Head: Normocephalic. No raccoon eyes or Battle's sign.     Comments: Posterior scalp hematoma, small wound noted.  No active bleeding.  Area is tender to palpation.    Right Ear: External ear normal. No hemotympanum.     Left Ear: External ear normal. No hemotympanum.     Nose: Nose normal.     Mouth/Throat:     Pharynx: Uvula midline.  Eyes:     General: Lids are normal.     Extraocular Movements:     Right eye: No nystagmus.     Left eye: No nystagmus.     Conjunctiva/sclera: Conjunctivae normal.     Pupils: Pupils are equal, round, and reactive to light.     Comments: No visible hyphema noted  Cardiovascular:     Rate and Rhythm: Normal rate and regular rhythm.  Pulmonary:     Effort: Pulmonary effort is normal.     Breath sounds: Normal breath sounds.  Abdominal:     Palpations: Abdomen is soft.     Tenderness: There is no abdominal tenderness.  Musculoskeletal:     Cervical back: Normal range of motion and neck supple. No tenderness or bony tenderness.     Thoracic back: No tenderness or bony tenderness.     Lumbar back: No tenderness or bony tenderness.  Skin:    General: Skin is warm and dry.  Neurological:     Mental Status: She is alert and oriented to person, place, and time.     GCS: GCS eye subscore is  4. GCS verbal subscore is 5. GCS motor subscore is 6.     Cranial Nerves: No cranial nerve deficit.     Sensory: No sensory deficit.     Coordination: Coordination normal.     Deep Tendon Reflexes: Reflexes are normal and symmetric.     Comments: Patient ambulated at bedside without difficulty, she does use a  rolling walker.     ED Results / Procedures / Treatments   Labs (all labs ordered are listed, but only abnormal results are displayed) Labs Reviewed - No data to display  EKG None  Radiology CT Head Wo Contrast  Result Date: 03/23/2022 CLINICAL DATA:  Head injury 2 weeks ago, continued pain. EXAM: CT HEAD WITHOUT CONTRAST CT CERVICAL SPINE WITHOUT CONTRAST TECHNIQUE: Multidetector CT imaging of the head and cervical spine was performed following the standard protocol without intravenous contrast. Multiplanar CT image reconstructions of the cervical spine were also generated. RADIATION DOSE REDUCTION: This exam was performed according to the departmental dose-optimization program which includes automated exposure control, adjustment of the mA and/or kV according to patient size and/or use of iterative reconstruction technique. COMPARISON:  None Available. FINDINGS: CT HEAD FINDINGS Brain: No evidence of acute infarction, hemorrhage, hydrocephalus, extra-axial collection or mass lesion/mass effect. Vascular: No hyperdense vessel. Atherosclerotic calcifications of the internal carotid arteries at the skull base. Skull: Extra calvarial hematoma overlies the right posterior parieto-occipital calvarium. Negative for fracture or focal lesion. Sinuses/Orbits: Mucosal thickening of the bilateral frontal, left sigmoid sinuses and ethmoid air cells. Orbits are grossly unremarkable. Other: Mastoid air cells are predominantly clear. CT CERVICAL SPINE FINDINGS Alignment: Mild reversal of the normal cervical lordosis is commonly positional or reflect muscle spasm. No evidence of traumatic listhesis. Skull  base and vertebrae: No acute fracture. No primary bone lesion or focal pathologic process. Soft tissues and spinal canal: No prevertebral fluid or swelling. No visible canal hematoma. Disc levels:  Multilevel degenerative change of the spine. Upper chest: Negative. Other: None IMPRESSION: 1. Extra calvarial hematoma overlies the right parieto-occipital calvarium. 2. No acute intracranial abnormality. 3. Mild reversal of the normal cervical lordosis is commonly positional or reflect muscle spasm. 4. No evidence of acute fracture or traumatic listhesis of the cervical spine. Electronically Signed   By: Maudry Mayhew M.D.   On: 03/23/2022 13:25   CT Cervical Spine Wo Contrast  Result Date: 03/23/2022 CLINICAL DATA:  Head injury 2 weeks ago, continued pain. EXAM: CT HEAD WITHOUT CONTRAST CT CERVICAL SPINE WITHOUT CONTRAST TECHNIQUE: Multidetector CT imaging of the head and cervical spine was performed following the standard protocol without intravenous contrast. Multiplanar CT image reconstructions of the cervical spine were also generated. RADIATION DOSE REDUCTION: This exam was performed according to the departmental dose-optimization program which includes automated exposure control, adjustment of the mA and/or kV according to patient size and/or use of iterative reconstruction technique. COMPARISON:  None Available. FINDINGS: CT HEAD FINDINGS Brain: No evidence of acute infarction, hemorrhage, hydrocephalus, extra-axial collection or mass lesion/mass effect. Vascular: No hyperdense vessel. Atherosclerotic calcifications of the internal carotid arteries at the skull base. Skull: Extra calvarial hematoma overlies the right posterior parieto-occipital calvarium. Negative for fracture or focal lesion. Sinuses/Orbits: Mucosal thickening of the bilateral frontal, left sigmoid sinuses and ethmoid air cells. Orbits are grossly unremarkable. Other: Mastoid air cells are predominantly clear. CT CERVICAL SPINE FINDINGS  Alignment: Mild reversal of the normal cervical lordosis is commonly positional or reflect muscle spasm. No evidence of traumatic listhesis. Skull base and vertebrae: No acute fracture. No primary bone lesion or focal pathologic process. Soft tissues and spinal canal: No prevertebral fluid or swelling. No visible canal hematoma. Disc levels:  Multilevel degenerative change of the spine. Upper chest: Negative. Other: None IMPRESSION: 1. Extra calvarial hematoma overlies the right parieto-occipital calvarium. 2. No acute intracranial abnormality. 3. Mild reversal of the normal cervical lordosis is commonly positional  or reflect muscle spasm. 4. No evidence of acute fracture or traumatic listhesis of the cervical spine. Electronically Signed   By: Dahlia Bailiff M.D.   On: 03/23/2022 13:25    Procedures Procedures    Medications Ordered in ED Medications - No data to display  ED Course/ Medical Decision Making/ A&P    Patient seen and examined. History obtained directly from patient. Work-up including labs, imaging, EKG ordered in triage, if performed, were reviewed.    Labs/EKG: None ordered  Imaging: Independently reviewed and interpreted.  This included: CT head and cervical spine, agree occipital hematoma, no obvious intracranial bleeding  Medications/Fluids: None ordered  Most recent vital signs reviewed and are as follows: BP 137/80 (BP Location: Left Arm)   Pulse 78   Temp 98.7 F (37.1 C) (Oral)   Resp 17   Ht 5' (1.524 m)   Wt 65.3 kg   SpO2 95%   BMI 28.12 kg/m   Initial impression: Scalp hematoma, minor head injury  Home treatment plan: Continue symptomatic management, OTC ibuprofen/Tylenol as needed  Return instructions discussed with patient: Return with worsening symptoms  Follow-up instructions discussed with patient: Follow-up with PCP in 1 week if not improving                          Medical Decision Making  Patient with head injury, now more than a week  ago.  CT head without any intracranial bleeding.  Low concern for concussion.  Patient does have a scalp hematoma without underlying fracture.  Neuro exam reassuring.        Final Clinical Impression(s) / ED Diagnoses Final diagnoses:  Minor head injury, initial encounter  Hematoma of occipital region of scalp    Rx / DC Orders ED Discharge Orders     None         Carlisle Cater, PA-C 03/23/22 1455    Carmin Muskrat, MD 03/24/22 [phone]

## 2022-03-23 NOTE — ED Provider Triage Note (Signed)
Emergency Medicine Provider Triage Evaluation Note  Angela Giles , a 66 y.o. female  was evaluated in triage.  Pt complains of head injury with neck pain.  Patient had a mechanical fall 2 weeks ago.  Patient states that she has continued to have pain to her neck and her head.  No loss of consciousness.  No vision changes.  Patient denies any paresthesias.  She has taken ibuprofen for the symptoms.  Patient does not take blood thinners..  Review of Systems  Positive: Headache, neck pain Negative: Loss of consciousness, vision changes, nausea, vomiting  Physical Exam  BP 137/80 (BP Location: Left Arm)   Pulse 78   Temp 98.7 F (37.1 C) (Oral)   Resp 17   Ht 5' (1.524 m)   Wt 65.3 kg   SpO2 95%   BMI 28.12 kg/m  Gen:   Awake, no distress patient alert and oriented x3.  Speech is clear. Resp:  Normal effort  MSK:   Moves extremities without difficulty Other:  The patient is alert, attentive, and oriented x 3. Speech is clear. Cranial nerve II-VII grossly intact. Negative pronator drift. Sensation intact. Strength 5/5 in all extremities. Rapid alternating movement and fine finger movements intact.   Medical Decision Making  Medically screening exam initiated at 12:31 PM.  Appropriate orders placed.  JESENYA BOWDITCH was informed that the remainder of the evaluation will be completed by another provider, this initial triage assessment does not replace that evaluation, and the importance of remaining in the ED until their evaluation is complete.  66 year old presents to the ER today for evaluation of headache and neck pain after mechanical fall with head injury 2 weeks ago.  Patient does not take any blood thinners.  Patient has no focal neurological deficit.  She is tender palpation of the cervical spine.  Labs not indicated but imaging is indicated.  We will need to assess for intracranial hemorrhage or fracture which is felt less likely.   Rise Mu, PA-C 03/23/22 1233

## 2022-03-23 NOTE — Telephone Encounter (Signed)
Advised if she was hurting to go be evaluated and treated thru ER, thanked me for calling

## 2022-03-23 NOTE — Telephone Encounter (Signed)
Patient states that she fell like a week and half ago and has a bump on her head. She states that it was not bleeding when she fell but the next day there was  dry blood on it. She wants to speak to someone about this

## 2022-03-23 NOTE — Telephone Encounter (Signed)
Patient called back and states that her head hurts and she wants to know if Dr Karel Jarvis wants her to go the ED

## 2022-03-24 NOTE — Telephone Encounter (Signed)
Patient went to the ED yesterday and wanted you to know so that you could look at what they said. Was not sure what Dr Karel Jarvis wanted to do

## 2022-03-25 NOTE — Telephone Encounter (Signed)
Called and left patient a voicemail message

## 2022-03-25 NOTE — Telephone Encounter (Signed)
Patient called me back and her ED results are in. Patient was taking 600 mg Ibuprofen. Patient said its still hurting with a hematoma

## 2022-03-26 NOTE — Telephone Encounter (Signed)
Called patient and let her know recommendations

## 2022-03-26 NOTE — Telephone Encounter (Signed)
Pls let her know I reviewed the notes and the scan. It is a scalp injury and we can only also recommend Tylenol or Ibuprofen. She can ask her PCP if she needs stronger pain medication. Thanks

## 2022-03-30 ENCOUNTER — Other Ambulatory Visit: Payer: Self-pay | Admitting: Emergency Medicine

## 2022-03-30 ENCOUNTER — Encounter: Payer: Self-pay | Admitting: Emergency Medicine

## 2022-03-30 ENCOUNTER — Ambulatory Visit (INDEPENDENT_AMBULATORY_CARE_PROVIDER_SITE_OTHER): Payer: Medicare Other | Admitting: Emergency Medicine

## 2022-03-30 VITALS — BP 136/82 | HR 79 | Temp 98.1°F | Ht 61.0 in | Wt 144.4 lb

## 2022-03-30 DIAGNOSIS — S0003XA Contusion of scalp, initial encounter: Secondary | ICD-10-CM

## 2022-03-30 DIAGNOSIS — Z862 Personal history of diseases of the blood and blood-forming organs and certain disorders involving the immune mechanism: Secondary | ICD-10-CM

## 2022-03-30 DIAGNOSIS — Z23 Encounter for immunization: Secondary | ICD-10-CM | POA: Diagnosis not present

## 2022-03-30 DIAGNOSIS — G44301 Post-traumatic headache, unspecified, intractable: Secondary | ICD-10-CM

## 2022-03-30 DIAGNOSIS — S0990XA Unspecified injury of head, initial encounter: Secondary | ICD-10-CM

## 2022-03-30 DIAGNOSIS — Z87828 Personal history of other (healed) physical injury and trauma: Secondary | ICD-10-CM | POA: Diagnosis not present

## 2022-03-30 DIAGNOSIS — I10 Essential (primary) hypertension: Secondary | ICD-10-CM

## 2022-03-30 DIAGNOSIS — E222 Syndrome of inappropriate secretion of antidiuretic hormone: Secondary | ICD-10-CM

## 2022-03-30 DIAGNOSIS — E871 Hypo-osmolality and hyponatremia: Secondary | ICD-10-CM

## 2022-03-30 LAB — COMPREHENSIVE METABOLIC PANEL
ALT: 8 U/L (ref 0–35)
AST: 15 U/L (ref 0–37)
Albumin: 4.5 g/dL (ref 3.5–5.2)
Alkaline Phosphatase: 95 U/L (ref 39–117)
BUN: 14 mg/dL (ref 6–23)
CO2: 27 mEq/L (ref 19–32)
Calcium: 9.6 mg/dL (ref 8.4–10.5)
Chloride: 92 mEq/L — ABNORMAL LOW (ref 96–112)
Creatinine, Ser: 0.85 mg/dL (ref 0.40–1.20)
GFR: 72.48 mL/min (ref >60.00)
Glucose, Bld: 109 mg/dL — ABNORMAL HIGH (ref 70–99)
Potassium: 4 mEq/L (ref 3.5–5.1)
Sodium: 127 mEq/L — ABNORMAL LOW (ref 135–145)
Total Bilirubin: 0.3 mg/dL (ref 0.2–1.2)
Total Protein: 7.4 g/dL (ref 6.0–8.3)

## 2022-03-30 LAB — CBC WITH DIFFERENTIAL/PLATELET
Basophils Absolute: 0.1 10*3/uL (ref 0.0–0.1)
Basophils Relative: 0.7 % (ref 0.0–3.0)
Eosinophils Absolute: 0.2 10*3/uL (ref 0.0–0.7)
Eosinophils Relative: 2.5 % (ref 0.0–5.0)
HCT: 38 % (ref 36.0–46.0)
Hemoglobin: 13.3 g/dL (ref 12.0–15.0)
Lymphocytes Relative: 31.7 % (ref 12.0–46.0)
Lymphs Abs: 2.7 10*3/uL (ref 0.7–4.0)
MCHC: 34.9 g/dL (ref 30.0–36.0)
MCV: 83.1 fl (ref 78.0–100.0)
Monocytes Absolute: 0.5 10*3/uL (ref 0.1–1.0)
Monocytes Relative: 6.1 % (ref 3.0–12.0)
Neutro Abs: 5 10*3/uL (ref 1.4–7.7)
Neutrophils Relative %: 59 % (ref 43.0–77.0)
Platelets: 327 10*3/uL (ref 150.0–400.0)
RBC: 4.58 Mil/uL (ref 3.87–5.11)
RDW: 12.6 % (ref 11.5–15.5)
WBC: 8.4 10*3/uL (ref 4.0–10.5)

## 2022-03-30 MED ORDER — ACETAMINOPHEN-CODEINE 300-30 MG PO TABS: 1.0000 | ORAL_TABLET | Freq: Four times a day (QID) | ORAL | 0 refills | Status: AC | PRN

## 2022-03-30 NOTE — Assessment & Plan Note (Signed)
Occasional headaches and affecting quality of life. Stable.  No red flag signs or symptoms. Tylenol for mild to moderate pain and Tylenol with codeine for moderate to severe headache.

## 2022-03-30 NOTE — Assessment & Plan Note (Signed)
Slowly improving still with occasional headaches. No symptoms of concussion

## 2022-03-30 NOTE — Assessment & Plan Note (Signed)
Slowly getting better.  Still slightly tender.

## 2022-03-30 NOTE — Assessment & Plan Note (Signed)
With hyponatremia.  CMP done today.

## 2022-03-30 NOTE — Progress Notes (Signed)
Angela Giles 66 y.o.   Chief Complaint  Patient presents with   Follow-up    Pt fell on 8/17 hit head on wooden desk, Pt still having some pain, knot on her head     HISTORY OF PRESENT ILLNESS: This is a 66 y.o. female here for follow-up of emergency department visit on 03/23/2022 when she presented with persistent headache following head injury sustained 1 week earlier.  Imaging including CT of brain and cervical spine negative except for scalp hematoma.  Still having some occasional headaches but overall doing better. Request some pain medication for headache. ED evaluation as follows: Medical Decision Making   Patient with head injury, now more than a week ago.  CT head without any intracranial bleeding.  Low concern for concussion.  Patient does have a scalp hematoma without underlying fracture.  Neuro exam reassuring.    HPI   Prior to Admission medications   Medication Sig Start Date End Date Taking? Authorizing Provider  acetaminophen (TYLENOL) 500 MG tablet Take 2 tablets (1,000 mg total) by mouth every 6 (six) hours as needed. 02/07/22 02/07/23 Yes Lovick, Montel Culver, MD  acetaminophen-codeine (TYLENOL #3) 300-30 MG tablet Take 1 tablet by mouth every 6 (six) hours as needed for moderate pain. 03/30/22  Yes Horald Pollen, MD  albuterol (PROVENTIL) (2.5 MG/3ML) 0.083% nebulizer solution Take 3 mLs (2.5 mg total) by nebulization every 6 (six) hours as needed for wheezing or shortness of breath. 10/09/21  Yes Curatolo, Adam, DO  albuterol (VENTOLIN HFA) 108 (90 Base) MCG/ACT inhaler Inhale 1-2 puffs into the lungs every 4 (four) hours as needed for shortness of breath or wheezing. 12/08/21  Yes Lucerito Rosinski, Ines Bloomer, MD  APTIOM 400 MG TABS Take 1 tablet (400 mg total) by mouth daily. Patient taking differently: Take 1 tablet by mouth every evening. 12/22/21  Yes Cameron Sprang, MD  aspirin 81 MG EC tablet Take 81 mg by mouth daily. Swallow whole.   Yes [provider]   diphenhydrAMINE (BENADRYL) 25 MG tablet Take 25 mg by mouth at bedtime.   Yes [provider]  docusate sodium (COLACE) 100 MG capsule Take 1 capsule (100 mg total) by mouth 2 (two) times daily. 02/07/22 02/07/23 Yes Lovick, Montel Culver, MD  hydrOXYzine (ATARAX) 25 MG tablet Take 1 tablet (25 mg total) by mouth at bedtime as needed. Patient taking differently: Take 25 mg by mouth at bedtime as needed for anxiety or itching. 02/02/22  Yes Duglas Heier, Ines Bloomer, MD  ibuprofen (ADVIL) 600 MG tablet Take 1 tablet (600 mg total) by mouth 4 (four) times daily. 02/07/22  Yes Jesusita Oka, MD  lacosamide (VIMPAT) 200 MG TABS tablet Take 1 tablet (200 mg total) by mouth 2 (two) times daily. 12/22/21  Yes Cameron Sprang, MD  losartan (COZAAR) 50 MG tablet Take 1 tablet (50 mg total) by mouth daily. 10/19/21  Yes Rino Hosea, Ines Bloomer, MD  Melatonin 5 MG CAPS Take 5 mg by mouth at bedtime.   Yes [provider]  methocarbamol (ROBAXIN-750) 750 MG tablet Take 1 tablet (750 mg total) by mouth 4 (four) times daily. 02/07/22  Yes Lovick, Montel Culver, MD  montelukast (SINGULAIR) 5 MG chewable tablet Chew 1 tablet (5 mg total) by mouth at bedtime. 02/18/22  Yes Keyvon Herter, Ines Bloomer, MD  omeprazole (PRILOSEC) 20 MG capsule Take 20 mg by mouth daily.   Yes [provider]  ondansetron (ZOFRAN) 4 MG tablet Take 1 tablet (4 mg total)  by mouth every 6 (six) hours as needed for nausea or vomiting. 12/17/21  Yes Plunkett, Loree Fee, MD  polyethylene glycol (MIRALAX) 17 g packet Take 17 g by mouth daily. Patient taking differently: Take 17 g by mouth daily as needed for moderate constipation. 10/12/21  Yes Shelly Coss, MD  simvastatin (ZOCOR) 20 MG tablet Take 1 tablet (20 mg total) by mouth at bedtime. 02/01/22  Yes Demetrice Combes, Ines Bloomer, MD  SYMBICORT 160-4.5 MCG/ACT inhaler Inhale 2 puffs into the lungs in the morning and at bedtime. 09/24/21  Yes [provider]  potassium chloride SA (KLOR-CON M)  20 MEQ tablet Take 2 tablets (40 mEq total) by mouth daily for 3 days. 10/13/21 12/22/21  Shelly Coss, MD    Allergies  Allergen Reactions   Beef-Derived Products    Fish Allergy    Milk-Related Compounds     Patient Active Problem List   Diagnosis Date Noted   SIADH (syndrome of inappropriate ADH production) ([place]) 10/19/2021   High cholesterol    GERD (gastroesophageal reflux disease)    Chronic pain syndrome 09/28/2021   Essential hypertension 09/28/2021   Chronic obstructive pulmonary disease ([place]) 09/28/2021   History of seizure disorder 09/28/2021   Insomnia 09/28/2021   Chronic bilateral low back pain without sciatica 09/28/2021    Past Medical History:  Diagnosis Date   Chronic back pain    COPD (chronic obstructive pulmonary disease) (HCC)    Dorsalgia    High cholesterol    Hypertension    Seizures (HCC)     Past Surgical History:  Procedure Laterality Date   ABDOMINAL HYSTERECTOMY     CHOLECYSTECTOMY     LAPAROSCOPIC APPENDECTOMY N/A 02/07/2022   Procedure: APPENDECTOMY LAPAROSCOPIC;  Surgeon: Jesusita Oka, MD;  Location: [facility] OR;  Service: General;  Laterality: N/A;    Social History   Socioeconomic History   Marital status: Divorced    Spouse name: Not on file   Number of children: 3   Years of education: Not on file   Highest education level: Not on file  Occupational History   Not on file  Tobacco Use   Smoking status: Former   Smokeless tobacco: Never  Vaping Use   Vaping Use: Never used  Substance and Sexual Activity   Alcohol use: Yes    Comment: occasionally   Drug use: Never   Sexual activity: Not on file  Other Topics Concern   Not on file  Social History Narrative   Lives in a one story home with a basement    Right handed    One soda of caffeine daily   Social Determinants of Health   Financial Resource Strain: Not on file  Food Insecurity: Not on file  Transportation Needs: Not on file  Physical Activity: Not on file   Stress: Not on file  Social Connections: Not on file  Intimate Partner Violence: Not on file    Family History  Problem Relation Age of Onset   Dementia Mother    Heart attack Father    Diabetes Father    Lupus Sister      Review of Systems  Constitutional: Negative.  Negative for chills and fever.  HENT: Negative.  Negative for congestion and sore throat.   Eyes: Negative.   Respiratory: Negative.  Negative for cough and shortness of breath.   Cardiovascular: Negative.  Negative for chest pain and palpitations.  Gastrointestinal:  Negative for abdominal pain, diarrhea, nausea and vomiting.  Genitourinary: Negative.  Negative for dysuria.  Skin: Negative.  Negative for rash.  Neurological:  Positive for headaches. Negative for dizziness, sensory change, focal weakness, seizures and loss of consciousness.  All other systems reviewed and are negative.  Today's Vitals   03/30/22 1010  BP: 136/82  Pulse: 79  Temp: 98.1 F (36.7 C)  TempSrc: Oral  SpO2: 97%  Weight: 144 lb 6 oz (65.5 kg)  Height: 5\' 1"  (1.549 m)   Body mass index is 27.28 kg/m.   Physical Exam Vitals reviewed.  Constitutional:      Appearance: Normal appearance.  HENT:     Head:     Comments: Right parietal temporal scalp hematoma slightly tender to touch Eyes:     Extraocular Movements: Extraocular movements intact.     Conjunctiva/sclera: Conjunctivae normal.     Pupils: Pupils are equal, round, and reactive to light.  Cardiovascular:     Rate and Rhythm: Normal rate and regular rhythm.     Pulses: Normal pulses.     Heart sounds: Normal heart sounds.  Pulmonary:     Effort: Pulmonary effort is normal.     Breath sounds: Normal breath sounds.  Musculoskeletal:     Cervical back: No tenderness.  Lymphadenopathy:     Cervical: No cervical adenopathy.  Skin:    General: Skin is warm and dry.     Capillary Refill: Capillary refill takes less than 2 seconds.  Neurological:     General: No  focal deficit present.     Mental Status: She is alert and oriented to person, place, and time.     Cranial Nerves: No cranial nerve deficit.     Motor: No weakness.     Coordination: Coordination normal.  Psychiatric:        Mood and Affect: Mood normal.        Behavior: Behavior normal.      ASSESSMENT & PLAN: A total of 46 minutes was spent with the patient and counseling/coordination of care regarding preparing for this visit, review of most recent office visit notes, review of most recent emergency department visit notes, review of most recent brain and cervical spine imaging, diagnosis of posttraumatic headaches and management, review of multiple chronic medical conditions and their management, review of all medications, pain management, prognosis, documentation, need for follow-up.  Problem List Items Addressed This Visit       Cardiovascular and Mediastinum   Essential hypertension    Well-controlled hypertension. Continue losartan 50 mg daily. BP Readings from Last 3 Encounters:  03/30/22 136/82  03/23/22 136/77  02/18/22 122/80           Endocrine   SIADH (syndrome of inappropriate ADH production) (HCC)    With hyponatremia.  CMP done today.        Other   Hyponatremia   Relevant Orders   Comprehensive metabolic panel (Completed)   Recent head injury    Slowly improving still with occasional headaches. No symptoms of concussion      Hematoma of scalp    Slowly getting better.  Still slightly tender.      Intractable post-traumatic headache - Primary    Occasional headaches and affecting quality of life. Stable.  No red flag signs or symptoms. Tylenol for mild to moderate pain and Tylenol with codeine for moderate to severe headache.      Relevant Medications   acetaminophen-codeine (TYLENOL #3) 300-30 MG tablet   Other Visit Diagnoses     Need for vaccination  Relevant Orders   Flu Vaccine QUAD 6+ mos PF IM (Fluarix Quad PF) (Completed)    History of anemia       Relevant Orders   CBC with Differential/Platelet (Completed)   History of head injury          Patient Instructions  Head Injury, Adult There are many types of head injuries. They can be as minor as a small bump. Some head injuries can be worse. Worse injuries include: A strong hit to the head that shakes the brain back and forth, causing damage (concussion). A bruise (contusion) of the brain. This means there is bleeding in the brain that can cause swelling. A cracked skull (skull fracture). Bleeding in the brain that gathers, gets thick (makes a clot), and forms a bump (hematoma). Most problems from a head injury come in the first 24 hours. However, you may still have side effects up to 7-10 days after your injury. It is important to watch your condition for any changes. You may need to be watched in the emergency department or urgent care, or you may need to stay in the hospital. What are the causes? There are many possible causes of a head injury. A serious head injury may be caused by: A car accident. Bicycle or motorcycle accidents. Sports injuries. Falls. Being hit by an object. What are the signs or symptoms? Symptoms of a head injury include a bruise, bump, or bleeding where the injury happened. Other physical symptoms may include: Headache. Feeling like you may vomit (nauseous) or vomiting. Dizziness. Blurred or double vision. Being uncomfortable around bright lights or loud noises. Shaking movements that you cannot control (seizures). Feeling tired. Trouble being woken up. Fainting or loss of consciousness. Mental or emotional symptoms may include: Feeling grumpy or cranky. Confusion and memory problems. Having trouble paying attention or concentrating. Changes in eating or sleeping habits. Feeling worried or nervous (anxious). Feeling sad (depressed). How is this treated? Treatment for this condition depends on how severe the injury is and  the type of injury you have. The main goal is to prevent problems and to allow the brain time to heal. Mild head injury If you have a mild head injury, you may be sent home, and treatment may include: Being watched. A responsible adult should stay with you for 24 hours after your injury and check on you often. Physical rest. Brain rest. Pain medicines. Severe head injury If you have a severe head injury, treatment may include: Being watched closely. This includes staying in the hospital. Medicines to: Help with pain. Prevent seizures. Help with brain swelling. Protecting your airway and using a machine that helps you breathe (ventilator). Treatments to watch for and manage swelling inside the brain. Brain surgery. This may be needed to: Remove a collection of blood or blood clots. Stop the bleeding. Remove a part of the skull. This allows room for the brain to swell. Follow these instructions at home: Activity Rest. Avoid activities that are hard or tiring. Make sure you get enough sleep. Let your brain rest. Do this by limiting activities that need a lot of thought or attention, such as: Watching TV. Playing memory games and puzzles. Job-related work or homework. Working on [employer], and texting. Avoid activities that could cause another head injury until your doctor says it is okay. This includes playing sports. Having another head injury, especially before the first one has healed, can be dangerous. Ask your doctor when it is safe for you  to go back to your normal activities, such as work or school. Ask your doctor for a step-by-step plan for slowly going back to your normal activities. Ask your doctor when you can drive, ride a bicycle, or use heavy machinery. Do not do these activities if you are dizzy. Lifestyle  Do not drink alcohol until your doctor says it is okay. Do not use drugs. If it is harder than usual to remember things, write them down. If you  are easily distracted, try to do one thing at a time. Talk with family members or close friends when making important decisions. Tell your friends, family, a trusted co-worker, and work Production designer, theatre/television/film about your injury, symptoms, and limits (restrictions). Have them watch for any problems that are new or getting worse. General instructions Take over-the-counter and prescription medicines only as told by your doctor. Have someone stay with you for 24 hours after your head injury. This person should watch you for any changes in your symptoms and be ready to get help. Keep all follow-up visits as told by your doctor. This is important. How is this prevented? Work on Therapist, music. This can help you avoid falls. Wear a seat belt when you are in a moving vehicle. Wear a helmet when you: Ride a bicycle. Ski. Do any other sport or activity that has a risk of injury. If you drink alcohol: Limit how much you use to: 0-1 drink a day for nonpregnant women. 0-2 drinks a day for men. Be aware of how much alcohol is in your drink. In the [place] one drink equals one 12 oz bottle of beer (355 mL), one 5 oz glass of wine (148 mL), or one 1 oz glass of hard liquor (44 mL). Make your home safer by: Getting rid of clutter from the floors and stairs. This includes things that can make you trip. Using grab bars in bathrooms and handrails by stairs. Placing non-slip mats on floors and in bathtubs. Putting more light in dim areas. Where to find more information Centers for Disease Control and Prevention: [employer].br Get help right away if: You have: A very bad headache that is not helped by medicine. Trouble walking or weakness in your arms and legs. Clear or bloody fluid coming from your nose or ears. Changes in how you see (vision). A seizure. More confusion or more grumpy moods. Your symptoms get worse. You are sleepier than normal and have trouble staying awake. You lose your balance. The black  centers of your eyes (pupils) change in size. Your speech is slurred. Your dizziness gets worse. You vomit. These symptoms may be an emergency. Do not wait to see if the symptoms will go away. Get medical help right away. Call your local emergency services (911 in the [place] Do not drive yourself to the hospital. Summary Head injuries can be as minor as a small bump. Some head injuries can be worse. Treatment for this condition depends on how severe the injury is and the type of injury you have. Have someone stay with you for 24 hours after your head injury. Ask your doctor when it is safe for you to go back to your normal activities, such as work or school. To prevent a head injury, wear a seat belt in a car, wear a helmet when you use a bicycle, limit your alcohol use, and make your home safer. This information is not intended to replace advice given to you by your health care provider. Make sure you  discuss any questions you have with your health care provider. Document Revised: 05/18/2019 Document Reviewed: 05/18/2019 Elsevier Patient Education  2023 Elsevier Inc.    Edwina Barth, MD [facility]

## 2022-03-30 NOTE — Patient Instructions (Signed)
Head Injury, Adult There are many types of head injuries. They can be as minor as a small bump. Some head injuries can be worse. Worse injuries include: A strong hit to the head that shakes the brain back and forth, causing damage (concussion). A bruise (contusion) of the brain. This means there is bleeding in the brain that can cause swelling. A cracked skull (skull fracture). Bleeding in the brain that gathers, gets thick (makes a clot), and forms a bump (hematoma). Most problems from a head injury come in the first 24 hours. However, you may still have side effects up to 7-10 days after your injury. It is important to watch your condition for any changes. You may need to be watched in the emergency department or urgent care, or you may need to stay in the hospital. What are the causes? There are many possible causes of a head injury. A serious head injury may be caused by: A car accident. Bicycle or motorcycle accidents. Sports injuries. Falls. Being hit by an object. What are the signs or symptoms? Symptoms of a head injury include a bruise, bump, or bleeding where the injury happened. Other physical symptoms may include: Headache. Feeling like you may vomit (nauseous) or vomiting. Dizziness. Blurred or double vision. Being uncomfortable around bright lights or loud noises. Shaking movements that you cannot control (seizures). Feeling tired. Trouble being woken up. Fainting or loss of consciousness. Mental or emotional symptoms may include: Feeling grumpy or cranky. Confusion and memory problems. Having trouble paying attention or concentrating. Changes in eating or sleeping habits. Feeling worried or nervous (anxious). Feeling sad (depressed). How is this treated? Treatment for this condition depends on how severe the injury is and the type of injury you have. The main goal is to prevent problems and to allow the brain time to heal. Mild head injury If you have a mild head  injury, you may be sent home, and treatment may include: Being watched. A responsible adult should stay with you for 24 hours after your injury and check on you often. Physical rest. Brain rest. Pain medicines. Severe head injury If you have a severe head injury, treatment may include: Being watched closely. This includes staying in the hospital. Medicines to: Help with pain. Prevent seizures. Help with brain swelling. Protecting your airway and using a machine that helps you breathe (ventilator). Treatments to watch for and manage swelling inside the brain. Brain surgery. This may be needed to: Remove a collection of blood or blood clots. Stop the bleeding. Remove a part of the skull. This allows room for the brain to swell. Follow these instructions at home: Activity Rest. Avoid activities that are hard or tiring. Make sure you get enough sleep. Let your brain rest. Do this by limiting activities that need a lot of thought or attention, such as: Watching TV. Playing memory games and puzzles. Job-related work or homework. Working on the computer, social media, and texting. Avoid activities that could cause another head injury until your doctor says it is okay. This includes playing sports. Having another head injury, especially before the first one has healed, can be dangerous. Ask your doctor when it is safe for you to go back to your normal activities, such as work or school. Ask your doctor for a step-by-step plan for slowly going back to your normal activities. Ask your doctor when you can drive, ride a bicycle, or use heavy machinery. Do not do these activities if you are dizzy. Lifestyle  Do   not drink alcohol until your doctor says it is okay. Do not use drugs. If it is harder than usual to remember things, write them down. If you are easily distracted, try to do one thing at a time. Talk with family members or close friends when making important decisions. Tell your  friends, family, a trusted co-worker, and work manager about your injury, symptoms, and limits (restrictions). Have them watch for any problems that are new or getting worse. General instructions Take over-the-counter and prescription medicines only as told by your doctor. Have someone stay with you for 24 hours after your head injury. This person should watch you for any changes in your symptoms and be ready to get help. Keep all follow-up visits as told by your doctor. This is important. How is this prevented? Work on your balance and strength. This can help you avoid falls. Wear a seat belt when you are in a moving vehicle. Wear a helmet when you: Ride a bicycle. Ski. Do any other sport or activity that has a risk of injury. If you drink alcohol: Limit how much you use to: 0-1 drink a day for nonpregnant women. 0-2 drinks a day for men. Be aware of how much alcohol is in your drink. In the [place] one drink equals one 12 oz bottle of beer (355 mL), one 5 oz glass of wine (148 mL), or one 1 oz glass of hard liquor (44 mL). Make your home safer by: Getting rid of clutter from the floors and stairs. This includes things that can make you trip. Using grab bars in bathrooms and handrails by stairs. Placing non-slip mats on floors and in bathtubs. Putting more light in dim areas. Where to find more information Centers for Disease Control and Prevention: www.cdc.gov Get help right away if: You have: A very bad headache that is not helped by medicine. Trouble walking or weakness in your arms and legs. Clear or bloody fluid coming from your nose or ears. Changes in how you see (vision). A seizure. More confusion or more grumpy moods. Your symptoms get worse. You are sleepier than normal and have trouble staying awake. You lose your balance. The black centers of your eyes (pupils) change in size. Your speech is slurred. Your dizziness gets worse. You vomit. These symptoms may be an  emergency. Do not wait to see if the symptoms will go away. Get medical help right away. Call your local emergency services (911 in the [place] Do not drive yourself to the hospital. Summary Head injuries can be as minor as a small bump. Some head injuries can be worse. Treatment for this condition depends on how severe the injury is and the type of injury you have. Have someone stay with you for 24 hours after your head injury. Ask your doctor when it is safe for you to go back to your normal activities, such as work or school. To prevent a head injury, wear a seat belt in a car, wear a helmet when you use a bicycle, limit your alcohol use, and make your home safer. This information is not intended to replace advice given to you by your health care provider. Make sure you discuss any questions you have with your health care provider. Document Revised: 05/18/2019 Document Reviewed: 05/18/2019 Elsevier Patient Education  2023 Elsevier Inc.  

## 2022-03-30 NOTE — Assessment & Plan Note (Signed)
Well-controlled hypertension. Continue losartan 50 mg daily. BP Readings from Last 3 Encounters:  03/30/22 136/82  03/23/22 136/77  02/18/22 122/80

## 2022-03-31 ENCOUNTER — Telehealth: Payer: Self-pay | Admitting: Emergency Medicine

## 2022-03-31 NOTE — Telephone Encounter (Signed)
Updated patient chart with shingles admininstration date

## 2022-03-31 NOTE — Telephone Encounter (Signed)
PT calls today in regards to information on their last Shingrix shot. PT had received their first shot for shingles a couple years back while in [place]. Their most recent shot for shingles was done here at a [facility] on June 19th.   PT was told during the [place] appointment that she only required one shot. The [facility] had not informed her as to what she would need to do after getting her first one done.   I did let PT know about the 3-6 month time frame that we give for administering Shingrix at our facility.  CB if needed: [phone]

## 2022-04-01 ENCOUNTER — Telehealth: Payer: Self-pay | Admitting: Emergency Medicine

## 2022-04-01 NOTE — Telephone Encounter (Signed)
Patient would like to be called concerning her lab results

## 2022-04-02 NOTE — Telephone Encounter (Signed)
Called patient in reference to her lab question. Explained patient lab results again. Patient now verbalize better understanding

## 2022-04-05 ENCOUNTER — Ambulatory Visit: Payer: Medicare (Managed Care) | Admitting: Emergency Medicine

## 2022-04-08 ENCOUNTER — Ambulatory Visit: Payer: Medicare (Managed Care) | Admitting: Emergency Medicine

## 2022-04-27 ENCOUNTER — Ambulatory Visit (INDEPENDENT_AMBULATORY_CARE_PROVIDER_SITE_OTHER): Payer: Medicare Other | Admitting: Emergency Medicine

## 2022-04-27 ENCOUNTER — Encounter: Payer: Self-pay | Admitting: Emergency Medicine

## 2022-04-27 VITALS — BP 134/86 | HR 70 | Temp 98.0°F | Ht 61.0 in | Wt 145.1 lb

## 2022-04-27 DIAGNOSIS — Z87828 Personal history of other (healed) physical injury and trauma: Secondary | ICD-10-CM

## 2022-04-27 DIAGNOSIS — R519 Headache, unspecified: Secondary | ICD-10-CM

## 2022-04-27 DIAGNOSIS — E871 Hypo-osmolality and hyponatremia: Secondary | ICD-10-CM

## 2022-04-27 DIAGNOSIS — J449 Chronic obstructive pulmonary disease, unspecified: Secondary | ICD-10-CM | POA: Diagnosis not present

## 2022-04-27 DIAGNOSIS — Z8669 Personal history of other diseases of the nervous system and sense organs: Secondary | ICD-10-CM

## 2022-04-27 DIAGNOSIS — R413 Other amnesia: Secondary | ICD-10-CM

## 2022-04-27 DIAGNOSIS — I10 Essential (primary) hypertension: Secondary | ICD-10-CM

## 2022-04-27 DIAGNOSIS — K219 Gastro-esophageal reflux disease without esophagitis: Secondary | ICD-10-CM

## 2022-04-27 DIAGNOSIS — G44301 Post-traumatic headache, unspecified, intractable: Secondary | ICD-10-CM | POA: Diagnosis not present

## 2022-04-27 DIAGNOSIS — G4700 Insomnia, unspecified: Secondary | ICD-10-CM

## 2022-04-27 DIAGNOSIS — S0990XA Unspecified injury of head, initial encounter: Secondary | ICD-10-CM

## 2022-04-27 DIAGNOSIS — E222 Syndrome of inappropriate secretion of antidiuretic hormone: Secondary | ICD-10-CM

## 2022-04-27 LAB — BASIC METABOLIC PANEL
BUN: 13 mg/dL (ref 6–23)
CO2: 27 mEq/L (ref 19–32)
Calcium: 9.5 mg/dL (ref 8.4–10.5)
Chloride: 97 mEq/L (ref 96–112)
Creatinine, Ser: 0.8 mg/dL (ref 0.40–1.20)
GFR: 77.91 mL/min (ref >60.00)
Glucose, Bld: 115 mg/dL — ABNORMAL HIGH (ref 70–99)
Potassium: 3.8 mEq/L (ref 3.5–5.1)
Sodium: 132 mEq/L — ABNORMAL LOW (ref 135–145)

## 2022-04-27 MED ORDER — ALBUTEROL SULFATE HFA 108 (90 BASE) MCG/ACT IN AERS: 1.0000 | INHALATION_SPRAY | RESPIRATORY_TRACT | 3 refills | Status: AC | PRN

## 2022-04-27 MED ORDER — METHOCARBAMOL 750 MG PO TABS: 750.0000 mg | ORAL_TABLET | Freq: Four times a day (QID) | ORAL | 1 refills | Status: AC

## 2022-04-27 MED ORDER — MONTELUKAST SODIUM 5 MG PO CHEW: 5.0000 mg | CHEWABLE_TABLET | Freq: Every day | ORAL | 1 refills | Status: DC

## 2022-04-27 MED ORDER — OMEPRAZOLE 20 MG PO CPDR: 20.0000 mg | DELAYED_RELEASE_CAPSULE | Freq: Every day | ORAL | 1 refills | Status: DC

## 2022-04-27 MED ORDER — HYDROXYZINE HCL 25 MG PO TABS: 25.0000 mg | ORAL_TABLET | Freq: Every evening | ORAL | 0 refills | Status: DC | PRN

## 2022-04-27 MED ORDER — SYMBICORT 160-4.5 MCG/ACT IN AERO
2.0000 | INHALATION_SPRAY | Freq: Two times a day (BID) | RESPIRATORY_TRACT | 1 refills | Status: DC
Start: 2022-04-27 — End: 2022-06-23

## 2022-04-27 NOTE — Assessment & Plan Note (Signed)
With persistent headaches. Also some cognitive deficits per patient. Recommend neurology evaluation. Referral placed today.

## 2022-04-27 NOTE — Patient Instructions (Signed)
Hyponatremia Hyponatremia is when the amount of salt (sodium) in your blood is too low. When salt levels are low, your body cells may take in extra water. This can cause swelling. The swelling often affects the brain. What are the causes? Certain medical problems or conditions. Vomiting a lot. Having watery poop (diarrhea) often. Sweating too much. Taking certain medicines or using illegal drugs. Fluids given through an IV tube. What increases the risk? Having heart, kidney, or liver failure. Having a medical condition that causes you to have watery poop a lot. Doing very hard exercises. Taking medicines that affect the amount of salt that is in your blood. What are the signs or symptoms? Symptoms of this condition include: Headache. Feeling like you may vomit (nausea). Vomiting. Being very tired. Muscle weakness and cramps. Not wanting to eat as much as normal. Feeling weak or dizzy. Very bad symptoms of this condition include: Confusion. Feeling restless. Having a fast heart rate. Fainting. Seizures. Coma. How is this treated? Treatment for this condition depends on the cause. Treatment may include: Getting fluids through an IV tube that is put into one of your veins. Taking medicines to fix the salt levels in your blood. If medicines are causing the problem, your medicines will need to be changed. Limiting how much water or fluid you take in, in some cases. Monitoring in the hospital to watch your symptoms. Follow these instructions at home:  Take over-the-counter and prescription medicines only as told by your doctor. Many medicines can make this condition worse. Talk with your doctor about any medicines that you are taking. Do not drink alcohol. Keep all follow-up visits. Contact a doctor if: You feel more like you may vomit. You feel more tired. Your headache gets worse. You feel more confused. You feel weaker. Your symptoms go away and then they come back. Get  help right away if: You have a seizure. You faint. You keep having watery poop. You keep vomiting. Summary Hyponatremia is when the amount of salt in your blood is too low. When salt levels are low, you can have swelling throughout the body. The swelling mostly affects the brain. Treatment depends on the cause. Treatment may include IV fluids and changing medicines. This information is not intended to replace advice given to you by your health care provider. Make sure you discuss any questions you have with your health care provider. Document Revised: 01/13/2021 Document Reviewed: 01/13/2021 Elsevier Patient Education  2023 Elsevier Inc.  

## 2022-04-27 NOTE — Assessment & Plan Note (Signed)
Stable.  Continue Symbicort and albuterol as rescue inhaler.

## 2022-04-27 NOTE — Assessment & Plan Note (Signed)
Intermittent and affecting quality of life. Needs neurology evaluation.

## 2022-04-27 NOTE — Assessment & Plan Note (Signed)
With persistent hyponatremia. Recommend nephrology evaluation. Referral placed today.

## 2022-04-27 NOTE — Assessment & Plan Note (Signed)
Stable.  Continue omeprazole 20mg daily

## 2022-04-27 NOTE — Progress Notes (Signed)
Angela Giles 66 y.o.   Chief Complaint  Patient presents with   Follow-up    4 week f/u appt, patient states she is having some memory issues, trouble sleeping and staying asleep     HISTORY OF PRESENT ILLNESS: This is a 66 y.o. female here for 4-week follow-up of head injury.  Still having intermittent headaches with memory issues. Also has history of sleep apnea.  Having trouble sleeping and or staying asleep. History of low sodium.  History of SIADH. No other complaints or medical concerns today.  HPI   Prior to Admission medications   Medication Sig Start Date End Date Taking? Authorizing Provider  acetaminophen (TYLENOL) 500 MG tablet Take 2 tablets (1,000 mg total) by mouth every 6 (six) hours as needed. 02/07/22 02/07/23 Yes Lovick, Lennie Odor, MD  acetaminophen-codeine (TYLENOL #3) 300-30 MG tablet Take 1 tablet by mouth every 6 (six) hours as needed for moderate pain. 03/30/22  Yes Georgina Quint, MD  albuterol (PROVENTIL) (2.5 MG/3ML) 0.083% nebulizer solution Take 3 mLs (2.5 mg total) by nebulization every 6 (six) hours as needed for wheezing or shortness of breath. 10/09/21  Yes Curatolo, Adam, DO  APTIOM 400 MG TABS Take 1 tablet (400 mg total) by mouth daily. Patient taking differently: Take 1 tablet by mouth every evening. 12/22/21  Yes Van Clines, MD  aspirin 81 MG EC tablet Take 81 mg by mouth daily. Swallow whole.   Yes [provider]  diphenhydrAMINE (BENADRYL) 25 MG tablet Take 25 mg by mouth at bedtime.   Yes [provider]  docusate sodium (COLACE) 100 MG capsule Take 1 capsule (100 mg total) by mouth 2 (two) times daily. 02/07/22 02/07/23 Yes Lovick, Lennie Odor, MD  ibuprofen (ADVIL) 600 MG tablet Take 1 tablet (600 mg total) by mouth 4 (four) times daily. 02/07/22  Yes Diamantina Monks, MD  lacosamide (VIMPAT) 200 MG TABS tablet Take 1 tablet (200 mg total) by mouth 2 (two) times daily. 12/22/21  Yes Van Clines, MD  losartan (COZAAR) 50  MG tablet Take 1 tablet (50 mg total) by mouth daily. 10/19/21  Yes Kayln Garceau, Eilleen Kempf, MD  Melatonin 5 MG CAPS Take 5 mg by mouth at bedtime.   Yes [provider]  ondansetron (ZOFRAN) 4 MG tablet Take 1 tablet (4 mg total) by mouth every 6 (six) hours as needed for nausea or vomiting. 12/17/21  Yes Plunkett, Alphonzo Lemmings, MD  polyethylene glycol (MIRALAX) 17 g packet Take 17 g by mouth daily. Patient taking differently: Take 17 g by mouth daily as needed for moderate constipation. 10/12/21  Yes Burnadette Pop, MD  simvastatin (ZOCOR) 20 MG tablet Take 1 tablet (20 mg total) by mouth at bedtime. 02/01/22  Yes Benay Pomeroy, Eilleen Kempf, MD  albuterol (VENTOLIN HFA) 108 (90 Base) MCG/ACT inhaler Inhale 1-2 puffs into the lungs every 4 (four) hours as needed for shortness of breath or wheezing. 04/27/22   Georgina Quint, MD  hydrOXYzine (ATARAX) 25 MG tablet Take 1 tablet (25 mg total) by mouth at bedtime as needed. 04/27/22   Georgina Quint, MD  methocarbamol (ROBAXIN-750) 750 MG tablet Take 1 tablet (750 mg total) by mouth 4 (four) times daily. 04/27/22   Georgina Quint, MD  montelukast (SINGULAIR) 5 MG chewable tablet Chew 1 tablet (5 mg total) by mouth at bedtime. 04/27/22   Georgina Quint, MD  omeprazole (PRILOSEC) 20 MG capsule Take 1 capsule (20 mg total) by mouth daily. 04/27/22  Georgina Quint, MD  potassium chloride SA (KLOR-CON M) 20 MEQ tablet Take 2 tablets (40 mEq total) by mouth daily for 3 days. 10/13/21 12/22/21  Burnadette Pop, MD  SYMBICORT 160-4.5 MCG/ACT inhaler Inhale 2 puffs into the lungs in the morning and at bedtime. 04/27/22   Georgina Quint, MD    Allergies  Allergen Reactions   Beef-Derived Products    Fish Allergy    Milk-Related Compounds     Patient Active Problem List   Diagnosis Date Noted   Persistent headaches 04/27/2022   Recent head injury 03/30/2022   Hematoma of scalp 03/30/2022   Intractable post-traumatic  headache 03/30/2022   SIADH (syndrome of inappropriate ADH production) (HCC) 10/19/2021   Hyponatremia 10/10/2021   High cholesterol    GERD (gastroesophageal reflux disease)    Chronic pain syndrome 09/28/2021   Essential hypertension 09/28/2021   Chronic obstructive pulmonary disease (HCC) 09/28/2021   History of sleep apnea 09/28/2021   Insomnia 09/28/2021   Chronic bilateral low back pain without sciatica 09/28/2021    Past Medical History:  Diagnosis Date   Chronic back pain    COPD (chronic obstructive pulmonary disease) (HCC)    Dorsalgia    High cholesterol    Hypertension    Seizures (HCC)     Past Surgical History:  Procedure Laterality Date   ABDOMINAL HYSTERECTOMY     CHOLECYSTECTOMY     LAPAROSCOPIC APPENDECTOMY N/A 02/07/2022   Procedure: APPENDECTOMY LAPAROSCOPIC;  Surgeon: Diamantina Monks, MD;  Location: [facility] OR;  Service: General;  Laterality: N/A;    Social History   Socioeconomic History   Marital status: Divorced    Spouse name: Not on file   Number of children: 3   Years of education: Not on file   Highest education level: Not on file  Occupational History   Not on file  Tobacco Use   Smoking status: Former   Smokeless tobacco: Never  Vaping Use   Vaping Use: Never used  Substance and Sexual Activity   Alcohol use: Yes    Comment: occasionally   Drug use: Never   Sexual activity: Not on file  Other Topics Concern   Not on file  Social History Narrative   Lives in a one story home with a basement    Right handed    One soda of caffeine daily   Social Determinants of Health   Financial Resource Strain: Not on file  Food Insecurity: Not on file  Transportation Needs: Not on file  Physical Activity: Not on file  Stress: Not on file  Social Connections: Not on file  Intimate Partner Violence: Not on file    Family History  Problem Relation Age of Onset   Dementia Mother    Heart attack Father    Diabetes Father    Lupus Sister       Review of Systems  Constitutional: Negative.  Negative for chills and fever.  HENT: Negative.  Negative for congestion and sore throat.   Respiratory: Negative.  Negative for cough and shortness of breath.   Cardiovascular: Negative.  Negative for chest pain and palpitations.  Gastrointestinal: Negative.  Negative for abdominal pain, nausea and vomiting.  Genitourinary: Negative.   Skin: Negative.  Negative for rash.  Neurological:  Positive for headaches. Negative for speech change, focal weakness, seizures and loss of consciousness.  All other systems reviewed and are negative.  Today's Vitals   04/27/22 1016  BP: 134/86  Pulse: 70  Temp: 98 F (36.7 C)  TempSrc: Oral  SpO2: 98%  Weight: 145 lb 2 oz (65.8 kg)  Height: 5\' 1"  (1.549 m)   Body mass index is 27.42 kg/m.   Physical Exam Vitals reviewed.  Constitutional:      Appearance: Normal appearance.  HENT:     Head: Normocephalic.     Mouth/Throat:     Mouth: Mucous membranes are moist.     Pharynx: Oropharynx is clear.  Eyes:     Extraocular Movements: Extraocular movements intact.     Conjunctiva/sclera: Conjunctivae normal.     Pupils: Pupils are equal, round, and reactive to light.  Cardiovascular:     Rate and Rhythm: Normal rate and regular rhythm.     Pulses: Normal pulses.     Heart sounds: Normal heart sounds.  Pulmonary:     Effort: Pulmonary effort is normal.     Breath sounds: Normal breath sounds.  Musculoskeletal:     Cervical back: No tenderness.  Lymphadenopathy:     Cervical: No cervical adenopathy.  Skin:    General: Skin is warm and dry.  Neurological:     General: No focal deficit present.     Mental Status: She is alert and oriented to person, place, and time.  Psychiatric:        Mood and Affect: Mood normal.        Behavior: Behavior normal.      ASSESSMENT & PLAN: A total of 45 minutes was spent with the patient and counseling/coordination of care regarding preparing  for this visit, review of most recent office visit notes, review of multiple chronic medical problems and their management, review of all medications, review of most recent blood work results, education on nutrition, prognosis, need for neurology and nephrology evaluations, documentation, and need for follow-up.  Problem List Items Addressed This Visit       Cardiovascular and Mediastinum   Essential hypertension    Well-controlled hypertension. BP Readings from Last 3 Encounters:  04/27/22 134/86  03/30/22 136/82  03/23/22 136/77  Continue losartan 50 mg daily         Respiratory   Chronic obstructive pulmonary disease (HCC)    Stable.  Continue Symbicort and albuterol as rescue inhaler.      Relevant Medications   albuterol (VENTOLIN HFA) 108 (90 Base) MCG/ACT inhaler   SYMBICORT 160-4.5 MCG/ACT inhaler   montelukast (SINGULAIR) 5 MG chewable tablet     Digestive   GERD (gastroesophageal reflux disease)    Stable.  Continue omeprazole 20 mg daily.      Relevant Medications   omeprazole (PRILOSEC) 20 MG capsule     Endocrine   SIADH (syndrome of inappropriate ADH production) (HCC)    With persistent hyponatremia. Recommend nephrology evaluation. Referral placed today.      Relevant Orders   Basic metabolic panel   Ambulatory referral to Nephrology     Other   History of sleep apnea   Insomnia   Relevant Medications   hydrOXYzine (ATARAX) 25 MG tablet   Hyponatremia   Relevant Orders   Basic metabolic panel   Ambulatory referral to Nephrology   Recent head injury    With persistent headaches. Also some cognitive deficits per patient. Recommend neurology evaluation. Referral placed today.      Intractable post-traumatic headache - Primary    Intermittent and affecting quality of life. Needs neurology evaluation.      Relevant Medications   methocarbamol (ROBAXIN-750) 750 MG tablet  Persistent headaches   Relevant Medications   methocarbamol  (ROBAXIN-750) 750 MG tablet   Other Relevant Orders   Ambulatory referral to Neurology   Other Visit Diagnoses     History of head injury       Relevant Orders   Ambulatory referral to Neurology   Memory problem          Patient Instructions  Hyponatremia Hyponatremia is when the amount of salt (sodium) in your blood is too low. When salt levels are low, your body cells may take in extra water. This can cause swelling. The swelling often affects the brain. What are the causes? Certain medical problems or conditions. Vomiting a lot. Having watery poop (diarrhea) often. Sweating too much. Taking certain medicines or using illegal drugs. Fluids given through an IV tube. What increases the risk? Having heart, kidney, or liver failure. Having a medical condition that causes you to have watery poop a lot. Doing very hard exercises. Taking medicines that affect the amount of salt that is in your blood. What are the signs or symptoms? Symptoms of this condition include: Headache. Feeling like you may vomit (nausea). Vomiting. Being very tired. Muscle weakness and cramps. Not wanting to eat as much as normal. Feeling weak or dizzy. Very bad symptoms of this condition include: Confusion. Feeling restless. Having a fast heart rate. Fainting. Seizures. Coma. How is this treated? Treatment for this condition depends on the cause. Treatment may include: Getting fluids through an IV tube that is put into one of your veins. Taking medicines to fix the salt levels in your blood. If medicines are causing the problem, your medicines will need to be changed. Limiting how much water or fluid you take in, in some cases. Monitoring in the hospital to watch your symptoms. Follow these instructions at home:  Take over-the-counter and prescription medicines only as told by your doctor. Many medicines can make this condition worse. Talk with your doctor about any medicines that you are  taking. Do not drink alcohol. Keep all follow-up visits. Contact a doctor if: You feel more like you may vomit. You feel more tired. Your headache gets worse. You feel more confused. You feel weaker. Your symptoms go away and then they come back. Get help right away if: You have a seizure. You faint. You keep having watery poop. You keep vomiting. Summary Hyponatremia is when the amount of salt in your blood is too low. When salt levels are low, you can have swelling throughout the body. The swelling mostly affects the brain. Treatment depends on the cause. Treatment may include IV fluids and changing medicines. This information is not intended to replace advice given to you by your health care provider. Make sure you discuss any questions you have with your health care provider. Document Revised: 01/13/2021 Document Reviewed: 01/13/2021 Elsevier Patient Education  [place], MD [facility]

## 2022-04-27 NOTE — Assessment & Plan Note (Signed)
Well-controlled hypertension. BP Readings from Last 3 Encounters:  04/27/22 134/86  03/30/22 136/82  03/23/22 136/77  Continue losartan 50 mg daily

## 2022-06-22 ENCOUNTER — Ambulatory Visit (INDEPENDENT_AMBULATORY_CARE_PROVIDER_SITE_OTHER): Payer: Medicare Other | Admitting: Emergency Medicine

## 2022-06-22 ENCOUNTER — Other Ambulatory Visit: Payer: Self-pay | Admitting: Emergency Medicine

## 2022-06-22 ENCOUNTER — Encounter: Payer: Self-pay | Admitting: Emergency Medicine

## 2022-06-22 VITALS — BP 132/88 | HR 64 | Temp 98.4°F | Ht 61.0 in | Wt 144.5 lb

## 2022-06-22 DIAGNOSIS — G894 Chronic pain syndrome: Secondary | ICD-10-CM

## 2022-06-22 DIAGNOSIS — K219 Gastro-esophageal reflux disease without esophagitis: Secondary | ICD-10-CM

## 2022-06-22 DIAGNOSIS — Z87828 Personal history of other (healed) physical injury and trauma: Secondary | ICD-10-CM

## 2022-06-22 DIAGNOSIS — E871 Hypo-osmolality and hyponatremia: Secondary | ICD-10-CM | POA: Diagnosis not present

## 2022-06-22 DIAGNOSIS — I10 Essential (primary) hypertension: Secondary | ICD-10-CM | POA: Diagnosis not present

## 2022-06-22 DIAGNOSIS — E222 Syndrome of inappropriate secretion of antidiuretic hormone: Secondary | ICD-10-CM | POA: Diagnosis not present

## 2022-06-22 MED ORDER — OMEPRAZOLE 40 MG PO CPDR: 40.0000 mg | DELAYED_RELEASE_CAPSULE | Freq: Every day | ORAL | 3 refills | Status: AC

## 2022-06-22 MED ORDER — MONTELUKAST SODIUM 5 MG PO CHEW: 5.0000 mg | CHEWABLE_TABLET | Freq: Every day | ORAL | 1 refills | Status: AC

## 2022-06-22 NOTE — Assessment & Plan Note (Signed)
Creating chronic hyponatremia. Stable.  Recent nephrologist evaluation and report discussed with patient. Water restriction emphasized.  Aware certain medications cause dry mouth as side effects.

## 2022-06-22 NOTE — Assessment & Plan Note (Signed)
Still active.  Diet and nutrition discussed. Will increase omeprazole to 40 mg daily.

## 2022-06-22 NOTE — Assessment & Plan Note (Signed)
Secondary to SIADH.  Stable.

## 2022-06-22 NOTE — Assessment & Plan Note (Signed)
Advised to avoid NSAIDs. 

## 2022-06-22 NOTE — Assessment & Plan Note (Signed)
Well-controlled. Continue losartan 50 mg daily. Cardiovascular risk associated with hypertension discussed. Dietary approaches to stop hypertension discussed.

## 2022-06-22 NOTE — Patient Instructions (Signed)
Syndrome of Inappropriate Antidiuresis Syndrome of inappropriate antidiuresis (SIAD), sometimes referred to as syndrome of inappropriate antidiuretic hormone (SIADH), is a condition in which extra antidiuretic hormone (ADH) is produced in the body or receptors of ADH have increased sensitivity. ADH controls water levels in the body. When too much of the hormone is produced, it can cause problems such as low salt (sodium) levels in the blood and reduced urine production. When this happens, the body retains too much water. Symptoms can range from mild to severe. SIAD is most common in people who are hospitalized for another reason. What are the causes? This condition may be caused by: Tumors. Injury to the brain, or infection or inflammation in the brain. Lung infections or diseases. Cancer. Major surgery. Extreme exercise. Stroke. Other causes may include: Problems with breathing (respiratory failure). Guillain-Barr syndrome (GBS). Medicines, including medicines that help remove water from the body (diuretics), certain antidepressants, chemotherapy drugs, pain medicines, and NSAIDs, such as ibuprofen. AIDS. What increases the risk? The following factors may make you more likely to develop this condition: Recent major surgery. Hospitalization. What are the signs or symptoms? Symptoms of this condition include: Irritability. Muscle cramps or weakness. Nausea or vomiting. Mental changes, such as memory problems, aggressiveness, confusion, or seeing, hearing, tasting, smelling, or feeling things that are not real (hallucinations). Seizure. Coma. How is this diagnosed? This condition may be diagnosed based on: Your medical history. A physical exam. Blood tests. Urine tests. Chest X-ray or brain scan. How is this treated? Treatment for this condition focuses on relieving symptoms. Treatment may include: Restricting fluid intake. Receiving saline, which is made of salt and water,  slowly through an IV. Taking diuretics. Taking medicines to help control sodium levels. Initial treatment for SIAD is done in a hospital, so your health care team can monitor your condition closely. Other treatments may focus on treating the underlying cause of the condition. Follow these instructions at home: Take over-the-counter and prescription medicines only as told by your health care provider. Follow instructions from your health care provider about eating or drinking restrictions. Keep all follow-up visits. This is important. Contact a health care provider if you: Have muscle cramps or weakness. Are nauseous or you vomit. Have changes in how you think, feel, or act (mental status). Get help right away if you have: A seizure. Loss of consciousness. Mental changes, such as memory problems, aggressiveness, confusion, or hallucinations. These symptoms may be an emergency. Get help right away. Call 911. Do not wait to see if the symptoms will go away. Do not drive yourself to the hospital. Summary Syndrome of inappropriate antidiuresis (SIAD) is a condition in which extra antidiuretic hormone (ADH) is produced in the body or receptors of ADH have increased sensitivity. SIAD is most common in people who are hospitalized for another reason. Initial treatment for SIAD is done in the hospital setting to monitor your condition closely. Follow instructions from your health care provider about eating or drinking restrictions. This information is not intended to replace advice given to you by your health care provider. Make sure you discuss any questions you have with your health care provider. Document Revised: 01/13/2021 Document Reviewed: 01/13/2021 Elsevier Patient Education  2023 Elsevier Inc.  

## 2022-06-22 NOTE — Progress Notes (Signed)
Angela Giles 66 y.o.   Chief Complaint  Patient presents with   Follow-up    F/u from seen kidney specialist     HISTORY OF PRESENT ILLNESS: This is a 66 y.o. female with history of chronic hyponatremia and SIADH recently seen by nephrologist, here for follow-up. Nephrologist consult/evaluation report reviewed with patient. Patient has history of seizures.  Takes Aptiom and also takes antihistamines occasionally.  As a result she gets a lot of dry mouth and drinks a lot of water. Overall doing well.  Has no other complaints or medical concerns today.  HPI   Prior to Admission medications   Medication Sig Start Date End Date Taking? Authorizing Provider  acetaminophen (TYLENOL) 500 MG tablet Take 2 tablets (1,000 mg total) by mouth every 6 (six) hours as needed. 02/07/22 02/07/23 Yes Lovick, Montel Culver, MD  acetaminophen-codeine (TYLENOL #3) 300-30 MG tablet Take 1 tablet by mouth every 6 (six) hours as needed for moderate pain. 03/30/22  Yes Horald Pollen, MD  albuterol (PROVENTIL) (2.5 MG/3ML) 0.083% nebulizer solution Take 3 mLs (2.5 mg total) by nebulization every 6 (six) hours as needed for wheezing or shortness of breath. 10/09/21  Yes Curatolo, Adam, DO  albuterol (VENTOLIN HFA) 108 (90 Base) MCG/ACT inhaler Inhale 1-2 puffs into the lungs every 4 (four) hours as needed for shortness of breath or wheezing. 04/27/22  Yes Antonino Nienhuis, Ines Bloomer, MD  APTIOM 400 MG TABS Take 1 tablet (400 mg total) by mouth daily. Patient taking differently: Take 1 tablet by mouth every evening. 12/22/21  Yes Cameron Sprang, MD  aspirin 81 MG EC tablet Take 81 mg by mouth daily. Swallow whole.   Yes [provider]  diphenhydrAMINE (BENADRYL) 25 MG tablet Take 25 mg by mouth at bedtime.   Yes [provider]  docusate sodium (COLACE) 100 MG capsule Take 1 capsule (100 mg total) by mouth 2 (two) times daily. 02/07/22 02/07/23 Yes Lovick, Montel Culver, MD  hydrOXYzine (ATARAX) 25 MG  tablet Take 1 tablet (25 mg total) by mouth at bedtime as needed. 04/27/22  Yes Ammanda Dobbins, Ines Bloomer, MD  ibuprofen (ADVIL) 600 MG tablet Take 1 tablet (600 mg total) by mouth 4 (four) times daily. 02/07/22  Yes Jesusita Oka, MD  lacosamide (VIMPAT) 200 MG TABS tablet Take 1 tablet (200 mg total) by mouth 2 (two) times daily. 12/22/21  Yes Cameron Sprang, MD  losartan (COZAAR) 50 MG tablet Take 1 tablet (50 mg total) by mouth daily. 10/19/21  Yes Airlie Blumenberg, Ines Bloomer, MD  Melatonin 5 MG CAPS Take 5 mg by mouth at bedtime.   Yes [provider]  methocarbamol (ROBAXIN-750) 750 MG tablet Take 1 tablet (750 mg total) by mouth 4 (four) times daily. 04/27/22  Yes Pat Elicker, Ines Bloomer, MD  omeprazole (PRILOSEC) 40 MG capsule Take 1 capsule (40 mg total) by mouth daily. 06/22/22  Yes Zacarias Krauter, Ines Bloomer, MD  ondansetron (ZOFRAN) 4 MG tablet Take 1 tablet (4 mg total) by mouth every 6 (six) hours as needed for nausea or vomiting. 12/17/21  Yes Plunkett, Loree Fee, MD  polyethylene glycol (MIRALAX) 17 g packet Take 17 g by mouth daily. Patient taking differently: Take 17 g by mouth daily as needed for moderate constipation. 10/12/21  Yes Shelly Coss, MD  simvastatin (ZOCOR) 20 MG tablet Take 1 tablet (20 mg total) by mouth at bedtime. 02/01/22  Yes Horald Pollen, MD  SYMBICORT 160-4.5 MCG/ACT inhaler Inhale 2 puffs into the lungs in  the morning and at bedtime. 04/27/22  Yes Kenzy Campoverde, Eilleen Kempf, MD  montelukast (SINGULAIR) 5 MG chewable tablet Chew 1 tablet (5 mg total) by mouth at bedtime. 06/22/22   Georgina Quint, MD  potassium chloride SA (KLOR-CON M) 20 MEQ tablet Take 2 tablets (40 mEq total) by mouth daily for 3 days. 10/13/21 12/22/21  Burnadette Pop, MD    Allergies  Allergen Reactions   Beef-Derived Products    Fish Allergy    Milk-Related Compounds     Patient Active Problem List   Diagnosis Date Noted   Persistent headaches 04/27/2022   Recent head injury  03/30/2022   Hematoma of scalp 03/30/2022   Intractable post-traumatic headache 03/30/2022   SIADH (syndrome of inappropriate ADH production) (HCC) 10/19/2021   Hyponatremia 10/10/2021   High cholesterol    GERD (gastroesophageal reflux disease)    Chronic pain syndrome 09/28/2021   Essential hypertension 09/28/2021   Chronic obstructive pulmonary disease (HCC) 09/28/2021   History of sleep apnea 09/28/2021   Insomnia 09/28/2021   Chronic bilateral low back pain without sciatica 09/28/2021    Past Medical History:  Diagnosis Date   Chronic back pain    COPD (chronic obstructive pulmonary disease) (HCC)    Dorsalgia    High cholesterol    Hypertension    Seizures (HCC)     Past Surgical History:  Procedure Laterality Date   ABDOMINAL HYSTERECTOMY     CHOLECYSTECTOMY     LAPAROSCOPIC APPENDECTOMY N/A 02/07/2022   Procedure: APPENDECTOMY LAPAROSCOPIC;  Surgeon: Diamantina Monks, MD;  Location: [facility] OR;  Service: General;  Laterality: N/A;    Social History   Socioeconomic History   Marital status: Divorced    Spouse name: Not on file   Number of children: 3   Years of education: Not on file   Highest education level: Not on file  Occupational History   Not on file  Tobacco Use   Smoking status: Former   Smokeless tobacco: Never  Vaping Use   Vaping Use: Never used  Substance and Sexual Activity   Alcohol use: Yes    Comment: occasionally   Drug use: Never   Sexual activity: Not on file  Other Topics Concern   Not on file  Social History Narrative   Lives in a one story home with a basement    Right handed    One soda of caffeine daily   Social Determinants of Health   Financial Resource Strain: Not on file  Food Insecurity: Not on file  Transportation Needs: Not on file  Physical Activity: Not on file  Stress: Not on file  Social Connections: Not on file  Intimate Partner Violence: Not on file    Family History  Problem Relation Age of Onset    Dementia Mother    Heart attack Father    Diabetes Father    Lupus Sister      Review of Systems  Constitutional: Negative.  Negative for chills and fever.  HENT: Negative.  Negative for congestion and sore throat.   Respiratory: Negative.  Negative for cough and shortness of breath.   Cardiovascular: Negative.  Negative for chest pain and palpitations.  Gastrointestinal:  Negative for abdominal pain, diarrhea, nausea and vomiting.  Genitourinary: Negative.  Negative for dysuria and frequency.  Musculoskeletal: Negative.   Skin: Negative.  Negative for rash.  Neurological: Negative.  Negative for dizziness and headaches.  All other systems reviewed and are negative.  Today's Vitals  06/22/22 1023  BP: 132/88  Pulse: 64  Temp: 98.4 F (36.9 C)  TempSrc: Oral  SpO2: 98%  Weight: 144 lb 8 oz (65.5 kg)  Height: 5\' 1"  (1.549 m)   Body mass index is 27.3 kg/m.   Physical Exam Vitals reviewed.  Constitutional:      Appearance: Normal appearance.  HENT:     Head: Normocephalic.     Mouth/Throat:     Mouth: Mucous membranes are moist.     Pharynx: Oropharynx is clear.  Eyes:     Extraocular Movements: Extraocular movements intact.     Pupils: Pupils are equal, round, and reactive to light.  Cardiovascular:     Rate and Rhythm: Normal rate and regular rhythm.     Pulses: Normal pulses.     Heart sounds: Normal heart sounds.  Pulmonary:     Effort: Pulmonary effort is normal.     Breath sounds: Normal breath sounds.  Musculoskeletal:     Cervical back: No tenderness.  Lymphadenopathy:     Cervical: No cervical adenopathy.  Skin:    General: Skin is warm and dry.     Capillary Refill: Capillary refill takes less than 2 seconds.  Neurological:     General: No focal deficit present.     Mental Status: She is alert and oriented to person, place, and time.  Psychiatric:        Mood and Affect: Mood normal.        Behavior: Behavior normal.      ASSESSMENT &  PLAN: A total of 42 minutes was spent with the patient and counseling/coordination of care regarding preparing for this visit, review of most recent office visit notes, review of most recent nephrologist office visit notes, review of most recent blood work results, review of multiple chronic medical conditions and their management, review of all medications, education on nutrition, prognosis, documentation and need for follow-up.  Problem List Items Addressed This Visit       Cardiovascular and Mediastinum   Essential hypertension - Primary    Well-controlled. Continue losartan 50 mg daily. Cardiovascular risk associated with hypertension discussed. Dietary approaches to stop hypertension discussed.        Digestive   GERD (gastroesophageal reflux disease)    Still active.  Diet and nutrition discussed. Will increase omeprazole to 40 mg daily.      Relevant Medications   omeprazole (PRILOSEC) 40 MG capsule     Endocrine   SIADH (syndrome of inappropriate ADH production) (HCC)    Creating chronic hyponatremia. Stable.  Recent nephrologist evaluation and report discussed with patient. Water restriction emphasized.  Aware certain medications cause dry mouth as side effects.        Other   Chronic pain syndrome    Advised to avoid NSAIDs.      Hyponatremia    Secondary to SIADH.  Stable.      History of head injury   Patient Instructions  Syndrome of Inappropriate Antidiuresis Syndrome of inappropriate antidiuresis (SIAD), sometimes referred to as syndrome of inappropriate antidiuretic hormone (SIADH), is a condition in which extra antidiuretic hormone (ADH) is produced in the body or receptors of ADH have increased sensitivity. ADH controls water levels in the body. When too much of the hormone is produced, it can cause problems such as low salt (sodium) levels in the blood and reduced urine production. When this happens, the body retains too much water. Symptoms can range  from mild to severe. SIAD is most  common in people who are hospitalized for another reason. What are the causes? This condition may be caused by: Tumors. Injury to the brain, or infection or inflammation in the brain. Lung infections or diseases. Cancer. Major surgery. Extreme exercise. Stroke. Other causes may include: Problems with breathing (respiratory failure). Guillain-Barr syndrome (GBS). Medicines, including medicines that help remove water from the body (diuretics), certain antidepressants, chemotherapy drugs, pain medicines, and NSAIDs, such as ibuprofen. AIDS. What increases the risk? The following factors may make you more likely to develop this condition: Recent major surgery. Hospitalization. What are the signs or symptoms? Symptoms of this condition include: Irritability. Muscle cramps or weakness. Nausea or vomiting. Mental changes, such as memory problems, aggressiveness, confusion, or seeing, hearing, tasting, smelling, or feeling things that are not real (hallucinations). Seizure. Coma. How is this diagnosed? This condition may be diagnosed based on: Your medical history. A physical exam. Blood tests. Urine tests. Chest X-ray or brain scan. How is this treated? Treatment for this condition focuses on relieving symptoms. Treatment may include: Restricting fluid intake. Receiving saline, which is made of salt and water, slowly through an IV. Taking diuretics. Taking medicines to help control sodium levels. Initial treatment for SIAD is done in a hospital, so your health care team can monitor your condition closely. Other treatments may focus on treating the underlying cause of the condition. Follow these instructions at home: Take over-the-counter and prescription medicines only as told by your health care provider. Follow instructions from your health care provider about eating or drinking restrictions. Keep all follow-up visits. This is  important. Contact a health care provider if you: Have muscle cramps or weakness. Are nauseous or you vomit. Have changes in how you think, feel, or act (mental status). Get help right away if you have: A seizure. Loss of consciousness. Mental changes, such as memory problems, aggressiveness, confusion, or hallucinations. These symptoms may be an emergency. Get help right away. Call 911. Do not wait to see if the symptoms will go away. Do not drive yourself to the hospital. Summary Syndrome of inappropriate antidiuresis (SIAD) is a condition in which extra antidiuretic hormone (ADH) is produced in the body or receptors of ADH have increased sensitivity. SIAD is most common in people who are hospitalized for another reason. Initial treatment for SIAD is done in the hospital setting to monitor your condition closely. Follow instructions from your health care provider about eating or drinking restrictions. This information is not intended to replace advice given to you by your health care provider. Make sure you discuss any questions you have with your health care provider. Document Revised: 01/13/2021 Document Reviewed: 01/13/2021 Elsevier Patient Education  2023 [employer]     Edwina Barth, MD [facility]

## 2022-06-23 ENCOUNTER — Telehealth: Payer: Self-pay | Admitting: Emergency Medicine

## 2022-06-23 DIAGNOSIS — J449 Chronic obstructive pulmonary disease, unspecified: Secondary | ICD-10-CM

## 2022-06-23 MED ORDER — SYMBICORT 160-4.5 MCG/ACT IN AERO: 2.0000 | INHALATION_SPRAY | Freq: Two times a day (BID) | RESPIRATORY_TRACT | 2 refills | Status: AC

## 2022-06-23 NOTE — Telephone Encounter (Signed)
Pt seen yesterday (06/22/22). Request Refill:  SYMBICORT 160-4.5 Inhaler No refills remain.  Preferred Pharmacy:  [facility] [address] ([address]), [place] - [address] [place] ([place]) [place] [address] Phone: [phone]  Fax: [phone]

## 2022-06-23 NOTE — Telephone Encounter (Signed)
New prescriptions sent to patient requested pharmacy

## 2022-07-16 ENCOUNTER — Encounter: Payer: Self-pay | Admitting: Neurology

## 2022-07-16 ENCOUNTER — Ambulatory Visit (INDEPENDENT_AMBULATORY_CARE_PROVIDER_SITE_OTHER): Payer: Medicare Other | Admitting: Neurology

## 2022-07-16 VITALS — BP 122/75 | HR 67 | Ht 60.0 in | Wt 147.0 lb

## 2022-07-16 DIAGNOSIS — G44329 Chronic post-traumatic headache, not intractable: Secondary | ICD-10-CM

## 2022-07-16 DIAGNOSIS — G40009 Localization-related (focal) (partial) idiopathic epilepsy and epileptic syndromes with seizures of localized onset, not intractable, without status epilepticus: Secondary | ICD-10-CM

## 2022-07-16 MED ORDER — NORTRIPTYLINE HCL 25 MG PO CAPS: 25.0000 mg | ORAL_CAPSULE | Freq: Every day | ORAL | 3 refills | Status: AC

## 2022-07-16 MED ORDER — APTIOM 400 MG PO TABS
1.0000 | ORAL_TABLET | Freq: Every evening | ORAL | 3 refills | Status: AC
Start: 2022-07-16 — End: ?

## 2022-07-16 MED ORDER — LACOSAMIDE 200 MG PO TABS: 200.0000 mg | ORAL_TABLET | Freq: Two times a day (BID) | ORAL | 3 refills | Status: AC
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
## 2022-07-16 NOTE — Patient Instructions (Signed)
Good to see you.  Start nortriptyline 25mg : take 1 capsule every night  2. Continue Lacosamide 200mg  twice a day and Aptiom 400mg  daily  3. Start cutting down on melatonin, ideally 3 or 5mg   4. Continue to monitor sodium levels  5. Follow-up with sleep specialist  6. Follow-up with Neurology in 3-6 months, call for any changes   Seizure Precautions: 1. If medication has been prescribed for you to prevent seizures, take it exactly as directed.  Do not stop taking the medicine without talking to your doctor first, even if you have not had a seizure in a long time.   2. Avoid activities in which a seizure would cause danger to yourself or to others.  Don't operate dangerous machinery, swim alone, or climb in high or dangerous places, such as on ladders, roofs, or girders.  Do not drive unless your doctor says you may.  3. If you have any warning that you may have a seizure, lay down in a safe place where you can't hurt yourself.    4.  No driving for 6 months from last seizure, as per [facility].   Please refer to the following link on the Epile[employer] Foundation of America's website for more information: http://www.epilepsyfoundation.org/answerplace/Social/driving/drivingu.cfm   5.  Maintain good sleep hygiene. Avoid alcohol.  6.  Contact your doctor if you have any problems that may be related to the medicine you are taking.  7.  Call 911 and bring the patient back to the ED if:        A.  The seizure lasts longer than 5 minutes.       B.  The patient doesn't awaken shortly after the seizure  C.  The patient has new problems such as difficulty seeing, speaking or moving  D.  The patient was injured during the seizure  E.  The patient has a temperature over 102 F (39C)  F.  The patient vomited and now is having trouble breathing

## 2022-07-30 ENCOUNTER — Other Ambulatory Visit: Payer: Self-pay | Admitting: Emergency Medicine

## 2022-07-30 DIAGNOSIS — G4700 Insomnia, unspecified: Secondary | ICD-10-CM

## 2022-07-30 DIAGNOSIS — I10 Essential (primary) hypertension: Secondary | ICD-10-CM

## 2022-08-06 ENCOUNTER — Other Ambulatory Visit: Payer: Self-pay | Admitting: Emergency Medicine

## 2022-08-26 ENCOUNTER — Ambulatory Visit: Payer: Medicaid - Out of State | Admitting: Emergency Medicine

## 2022-11-03 ENCOUNTER — Other Ambulatory Visit: Payer: Self-pay | Admitting: Emergency Medicine

## 2022-11-03 DIAGNOSIS — I10 Essential (primary) hypertension: Secondary | ICD-10-CM

## 2022-12-21 ENCOUNTER — Ambulatory Visit: Payer: Medicare Other | Admitting: Emergency Medicine

## 2023-04-26 IMAGING — CR DG ANKLE COMPLETE 3+V*L*
3 series · 3 of 3 positions shown · non-contrast
Comparison: None.

CLINICAL DATA: Acute left ankle pain without known injury.

EXAM:
LEFT ANKLE COMPLETE - 3+ VIEW

[x ankle lat left (1 of 3)]
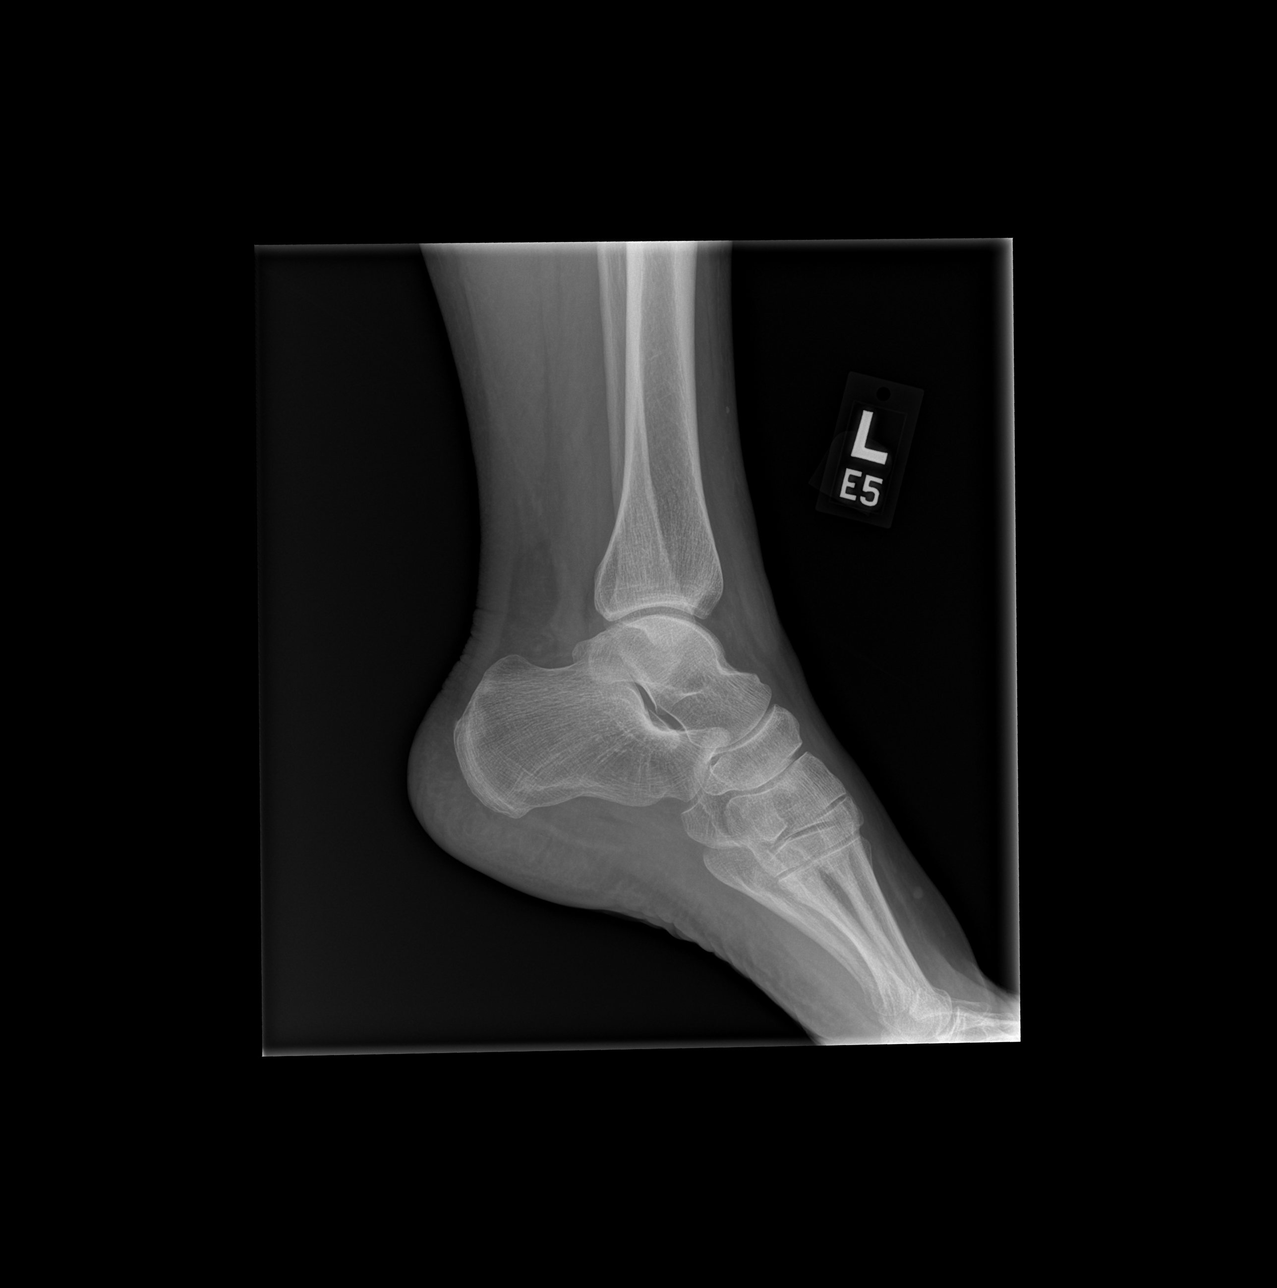

[x ankle lat left (2 of 3)]
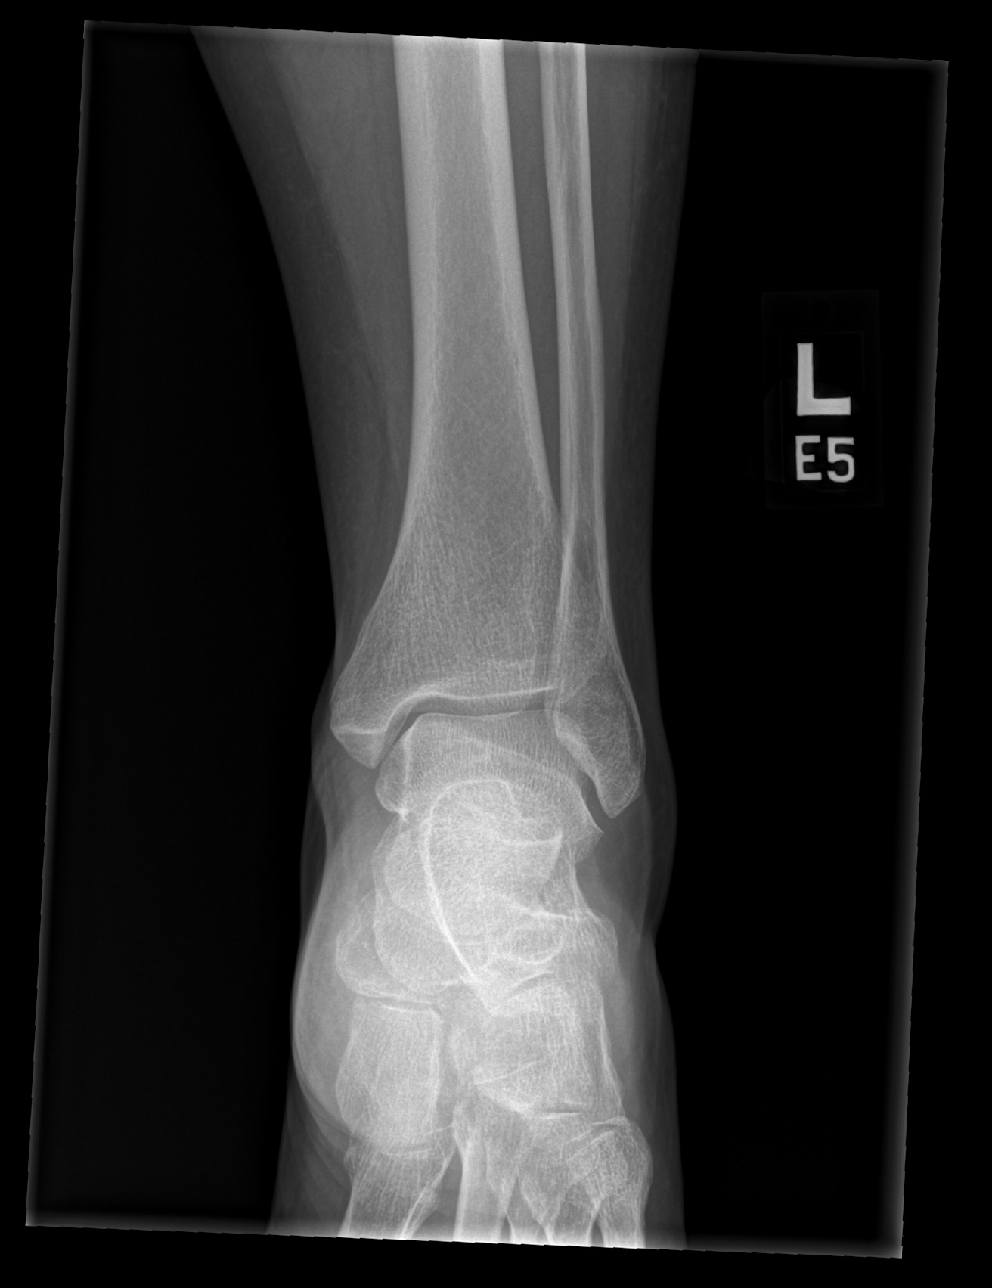

[x ankle lat left (3 of 3)]
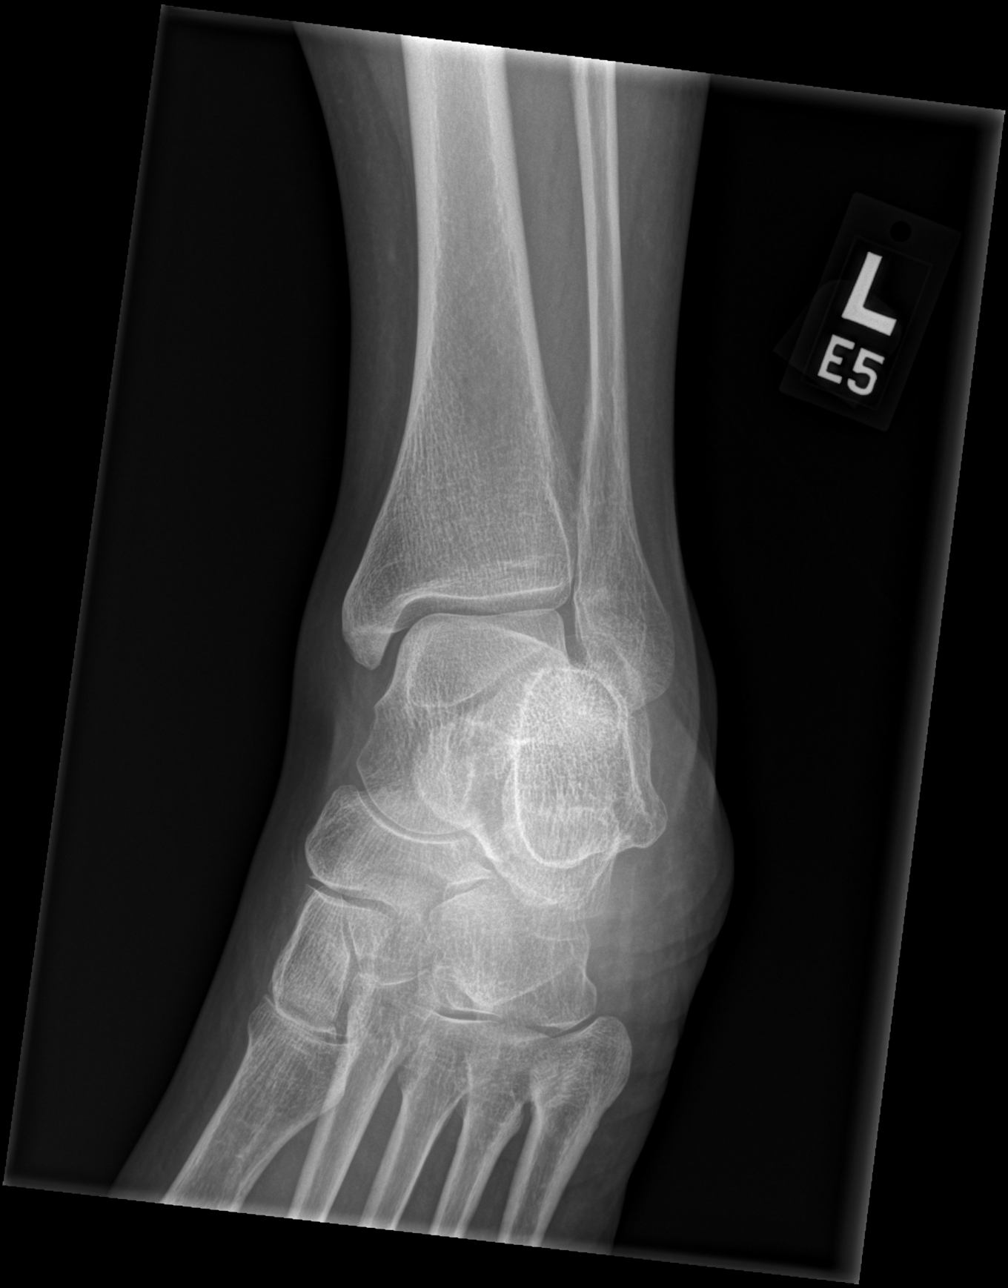

[3 of 3 positions shown; findings below may reference images not displayed]

FINDINGS: There is no evidence of fracture, dislocation, or joint effusion.
There is no evidence of arthropathy or other focal bone abnormality.
Soft tissues are unremarkable.
IMPRESSION: Negative.

## 2023-04-26 IMAGING — CR DG KNEE 1-2V*L*
2 series · 2 of 2 positions shown · non-contrast
Comparison: None.

CLINICAL DATA: Acute left knee pain without known injury.

EXAM:
LEFT KNEE - 1-2 VIEW

[x knee ap left]
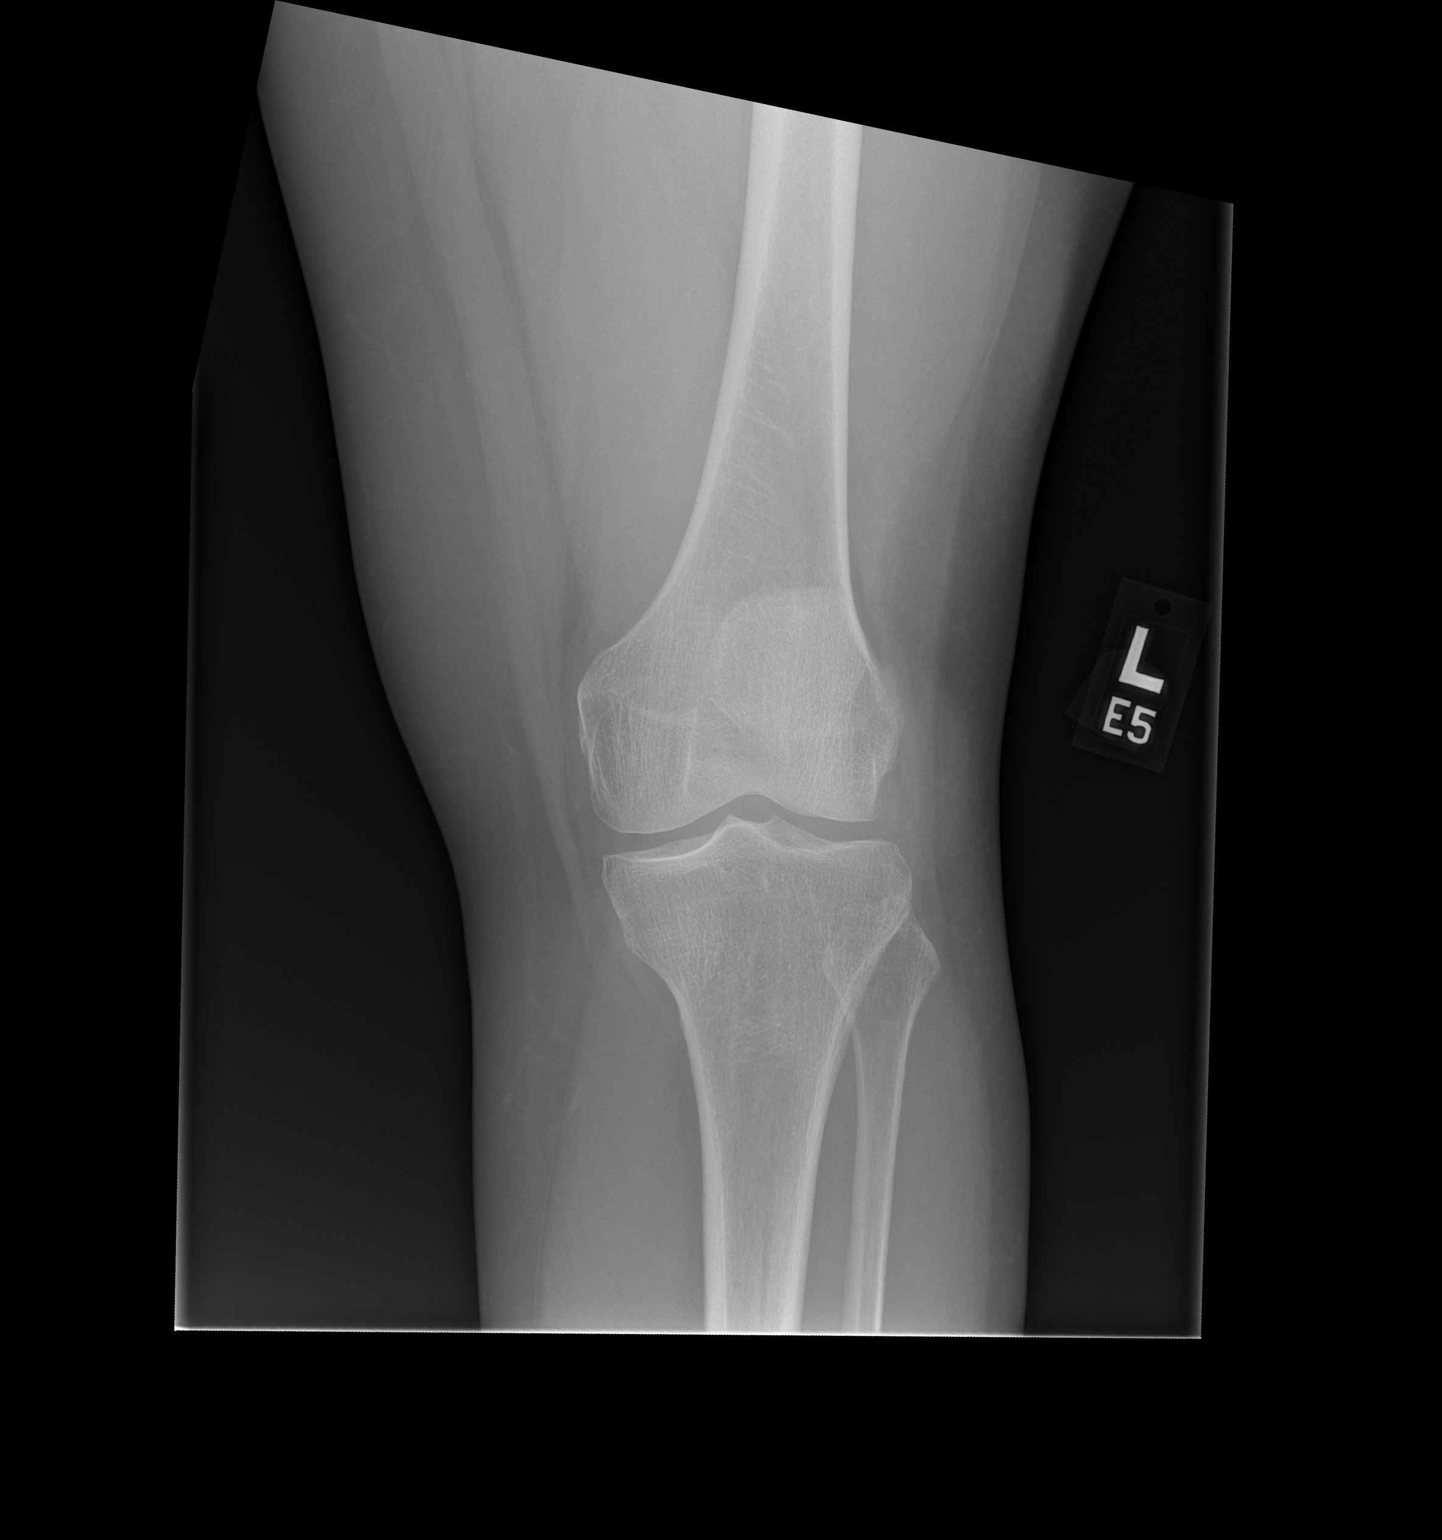

[x knee lat left]
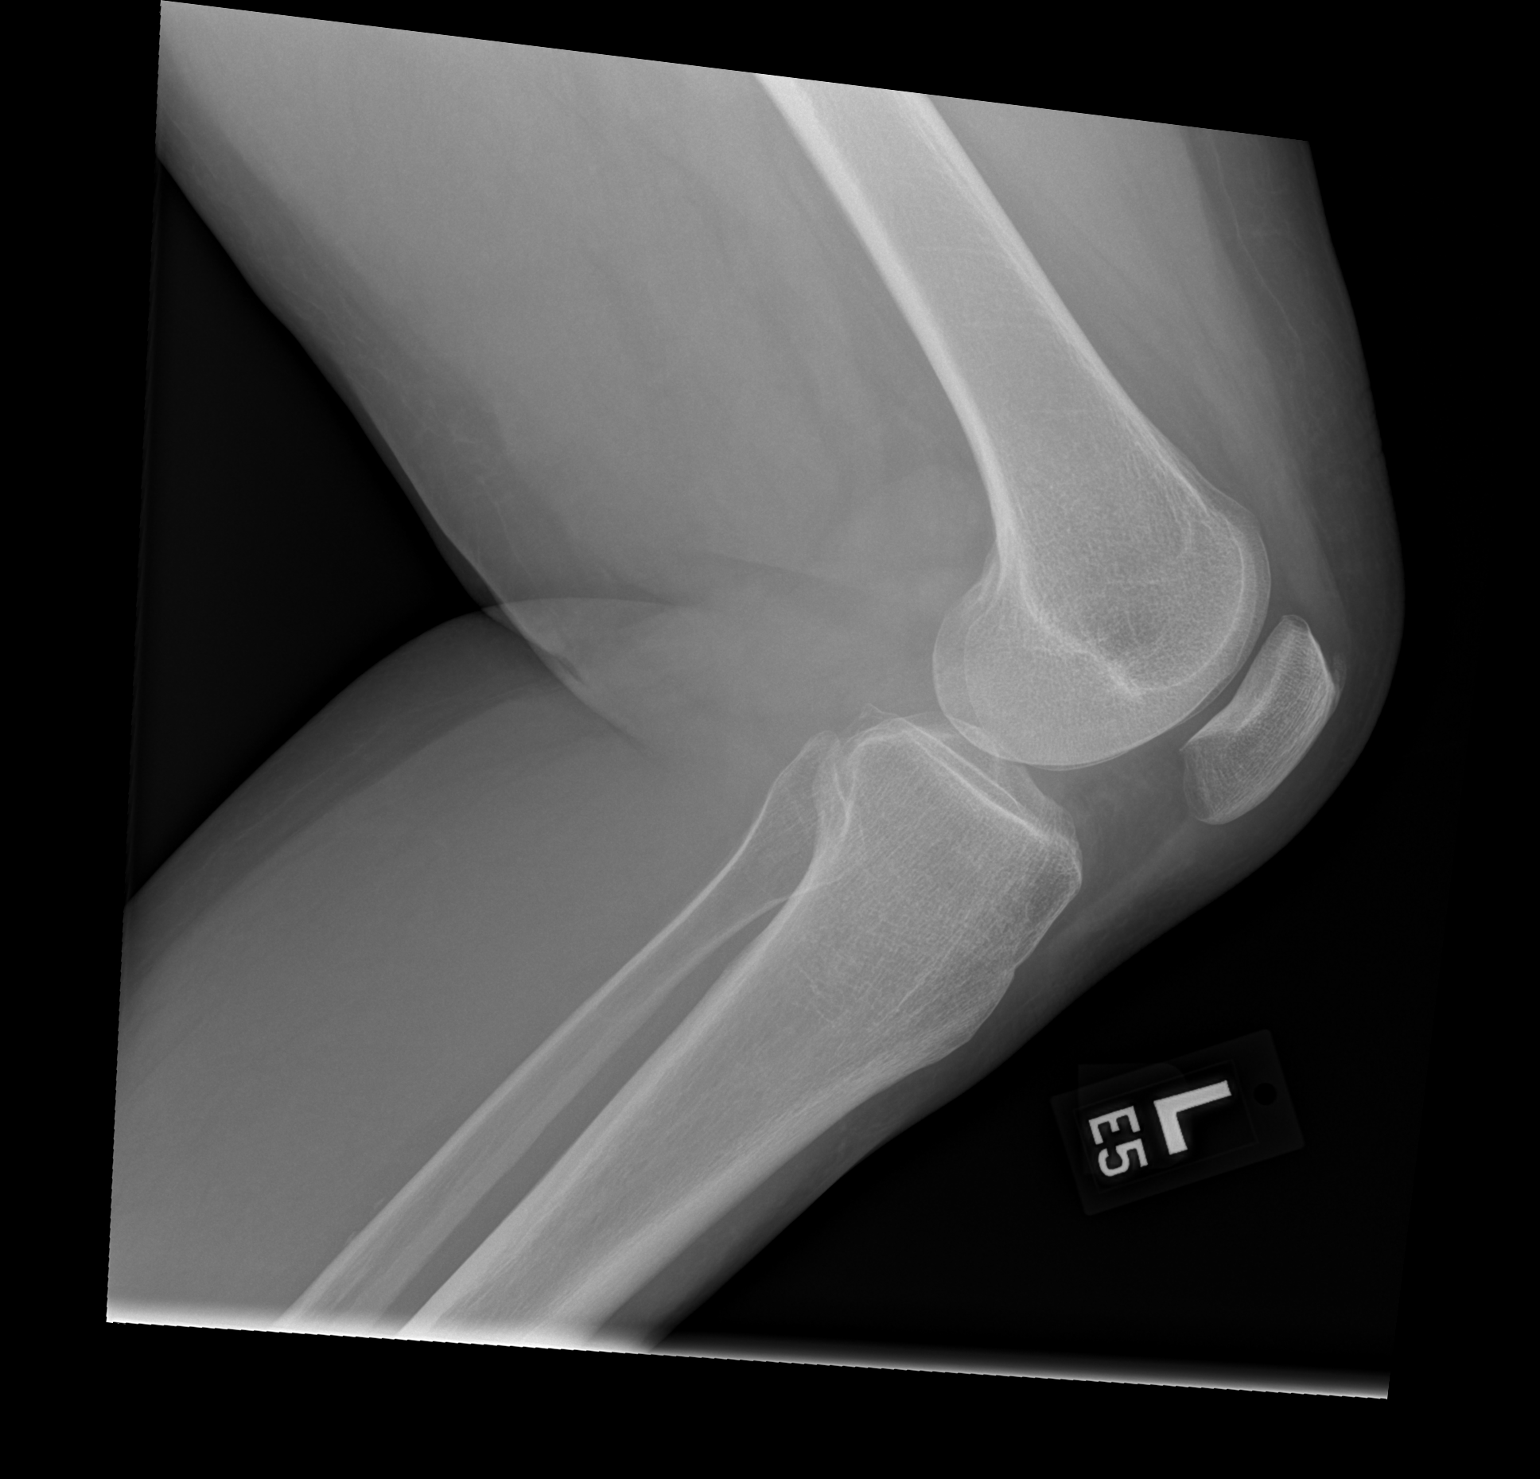

[2 of 2 positions shown; findings below may reference images not displayed]

FINDINGS: No evidence of fracture, dislocation, or joint effusion. No evidence
of arthropathy or other focal bone abnormality. Soft tissues are
unremarkable.
IMPRESSION: Negative.

## 2023-07-18 ENCOUNTER — Other Ambulatory Visit: Payer: Self-pay | Admitting: Neurology

## 2023-11-21 IMAGING — CR DG CHEST 2V
2 series · 2 of 2 positions shown · non-contrast
Comparison: 06/14/2021

CLINICAL DATA: 65-year-old female with chest pain.

EXAM:
CHEST - 2 VIEW

[chest pa]
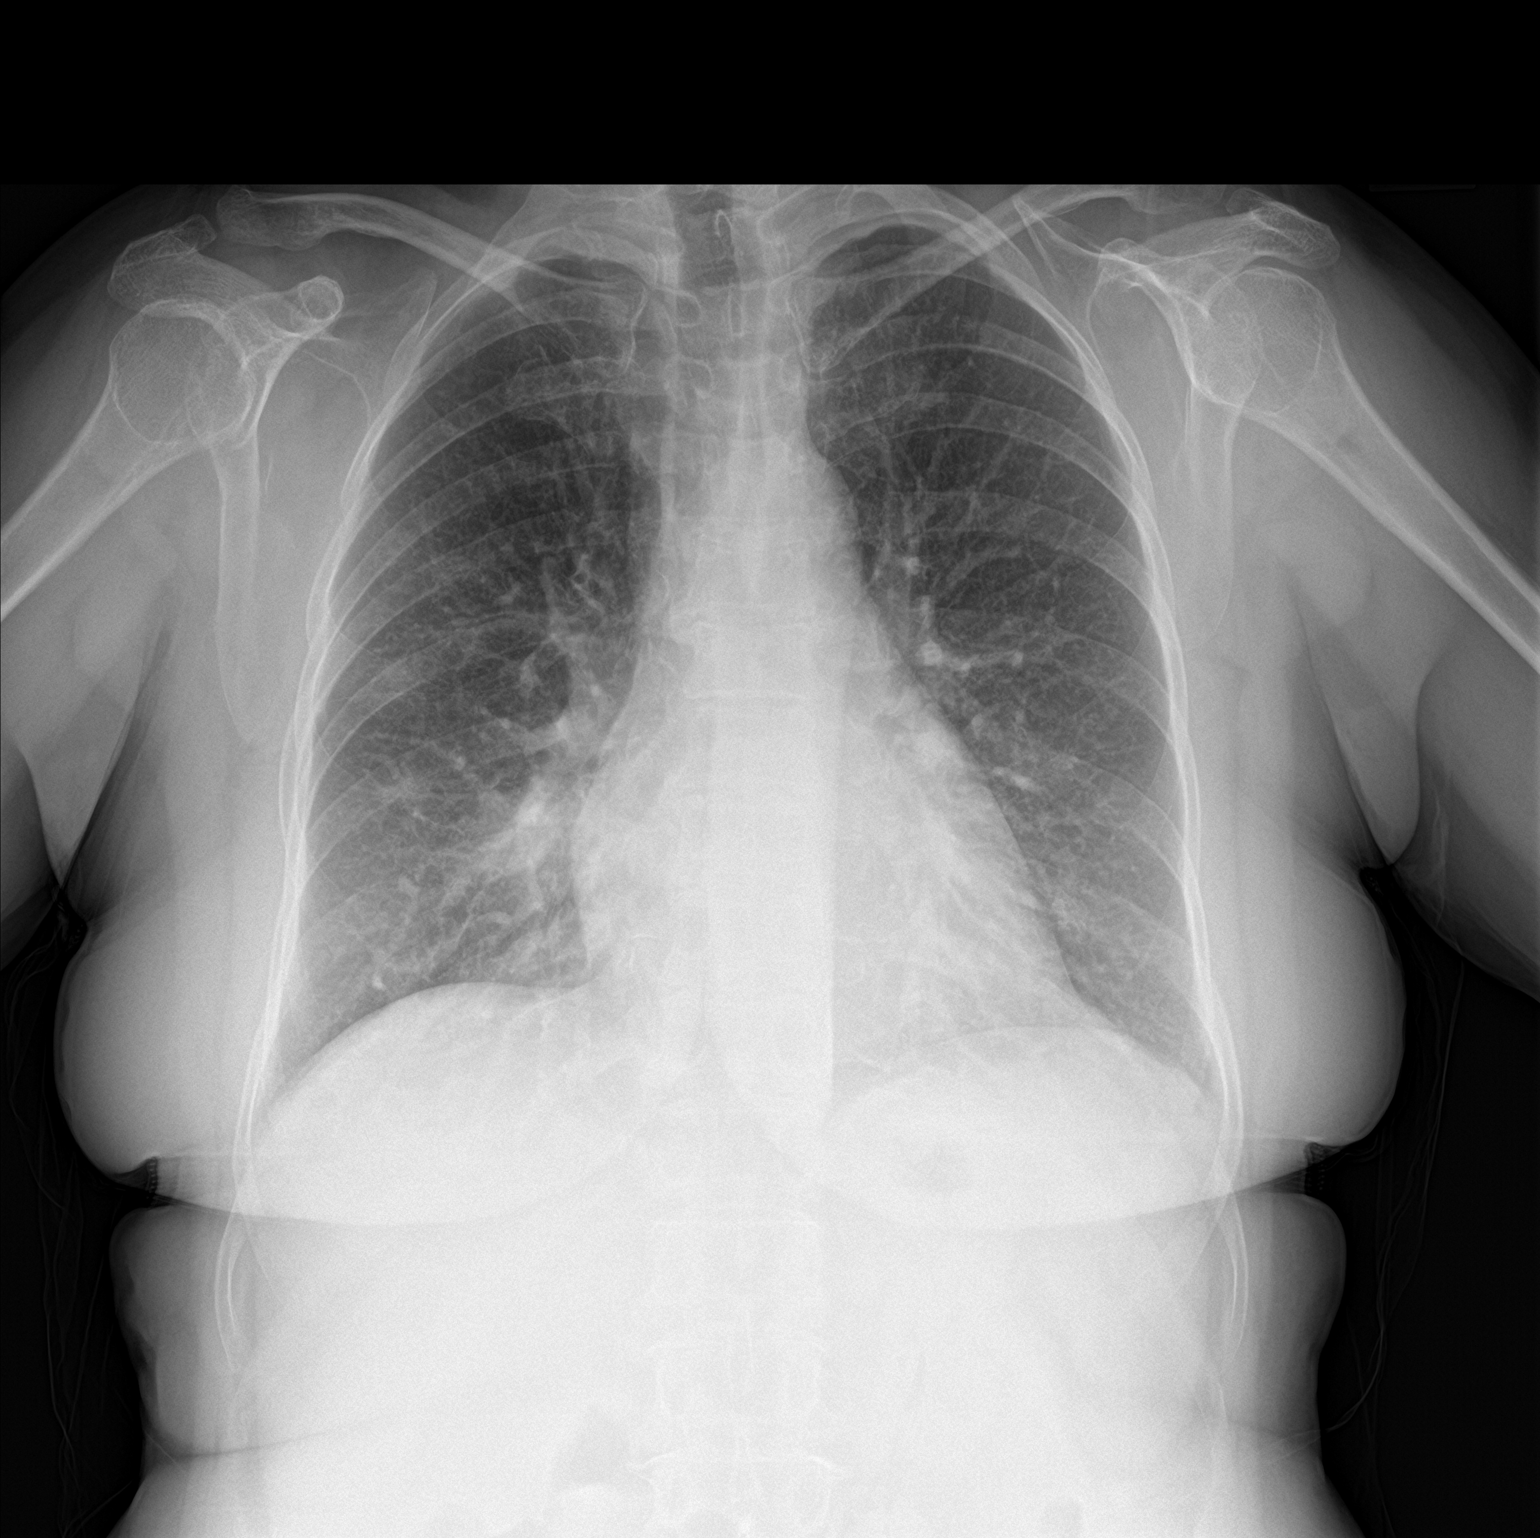

[chest lat]
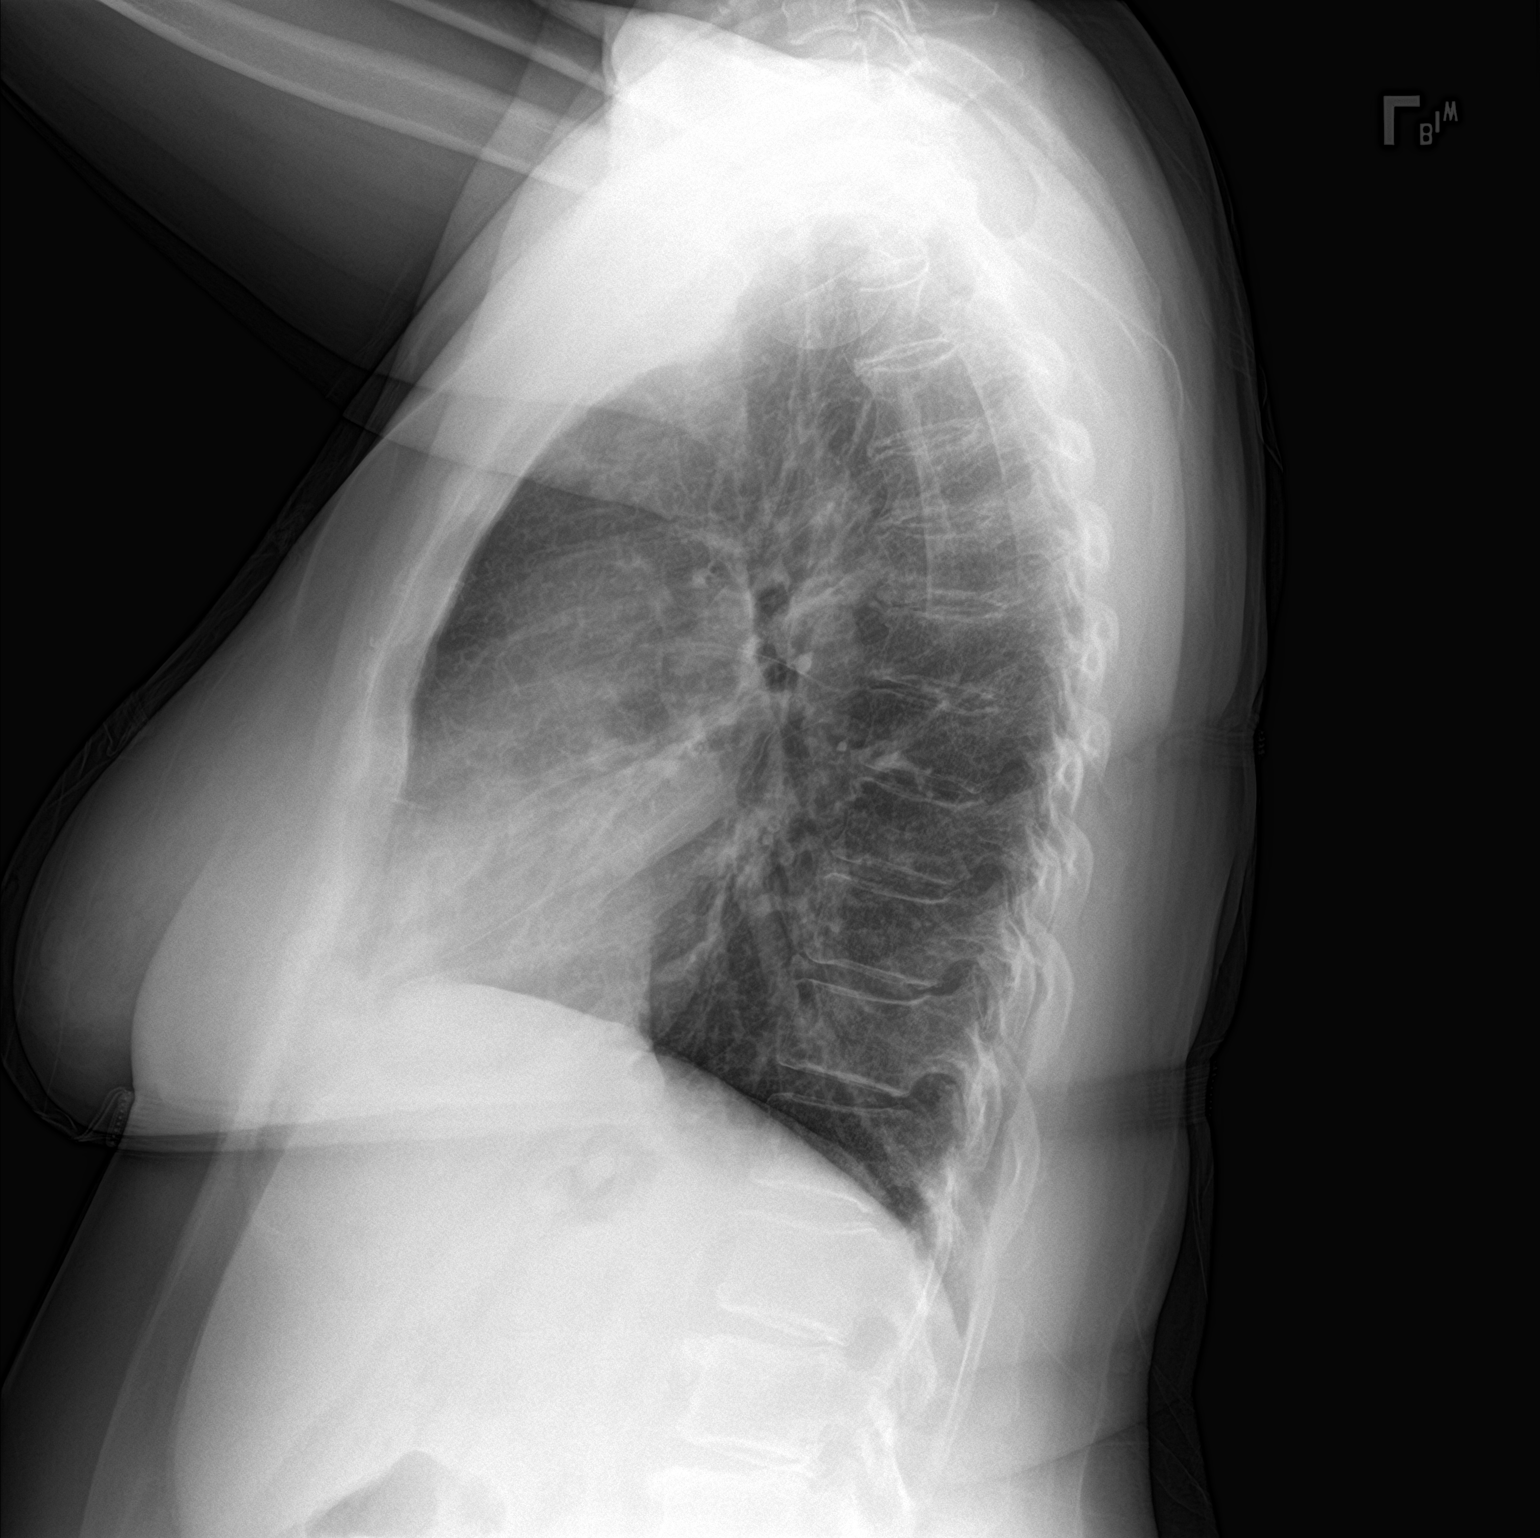

[2 of 2 positions shown; findings below may reference images not displayed]

FINDINGS: The mediastinal contours are within normal limits. No cardiomegaly.
The lungs are clear bilaterally without evidence of focal
consolidation, pleural effusion, or pneumothorax. No acute osseous
abnormality.
IMPRESSION: No acute cardiopulmonary process.

## 2023-11-22 IMAGING — CR DG CHEST 2V
2 series · 2 of 2 positions shown · non-contrast
Comparison: Radiograph 10/09/2021

CLINICAL DATA: cough, fever, vomiting

EXAM:
CHEST - 2 VIEW

[chest pa]
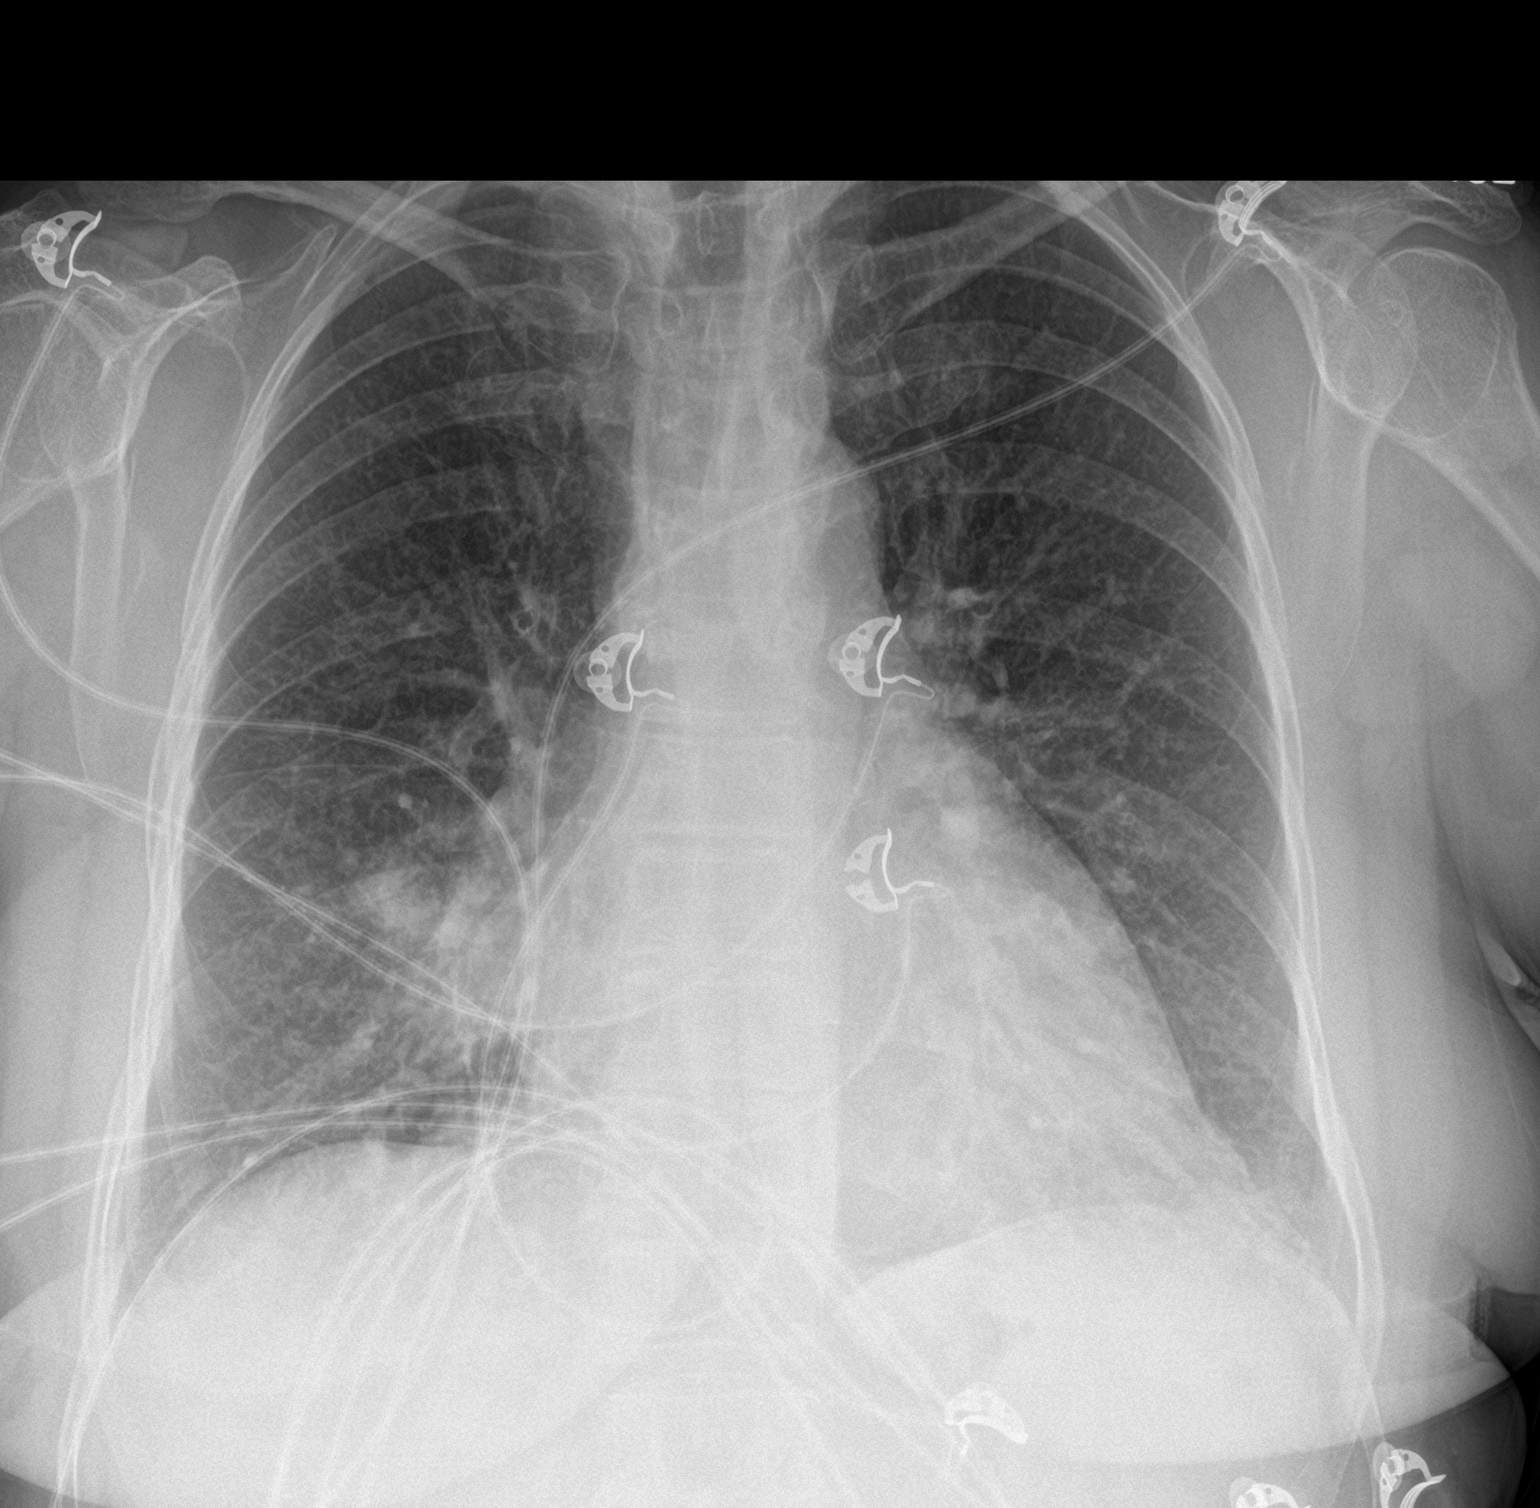

[chest lat]
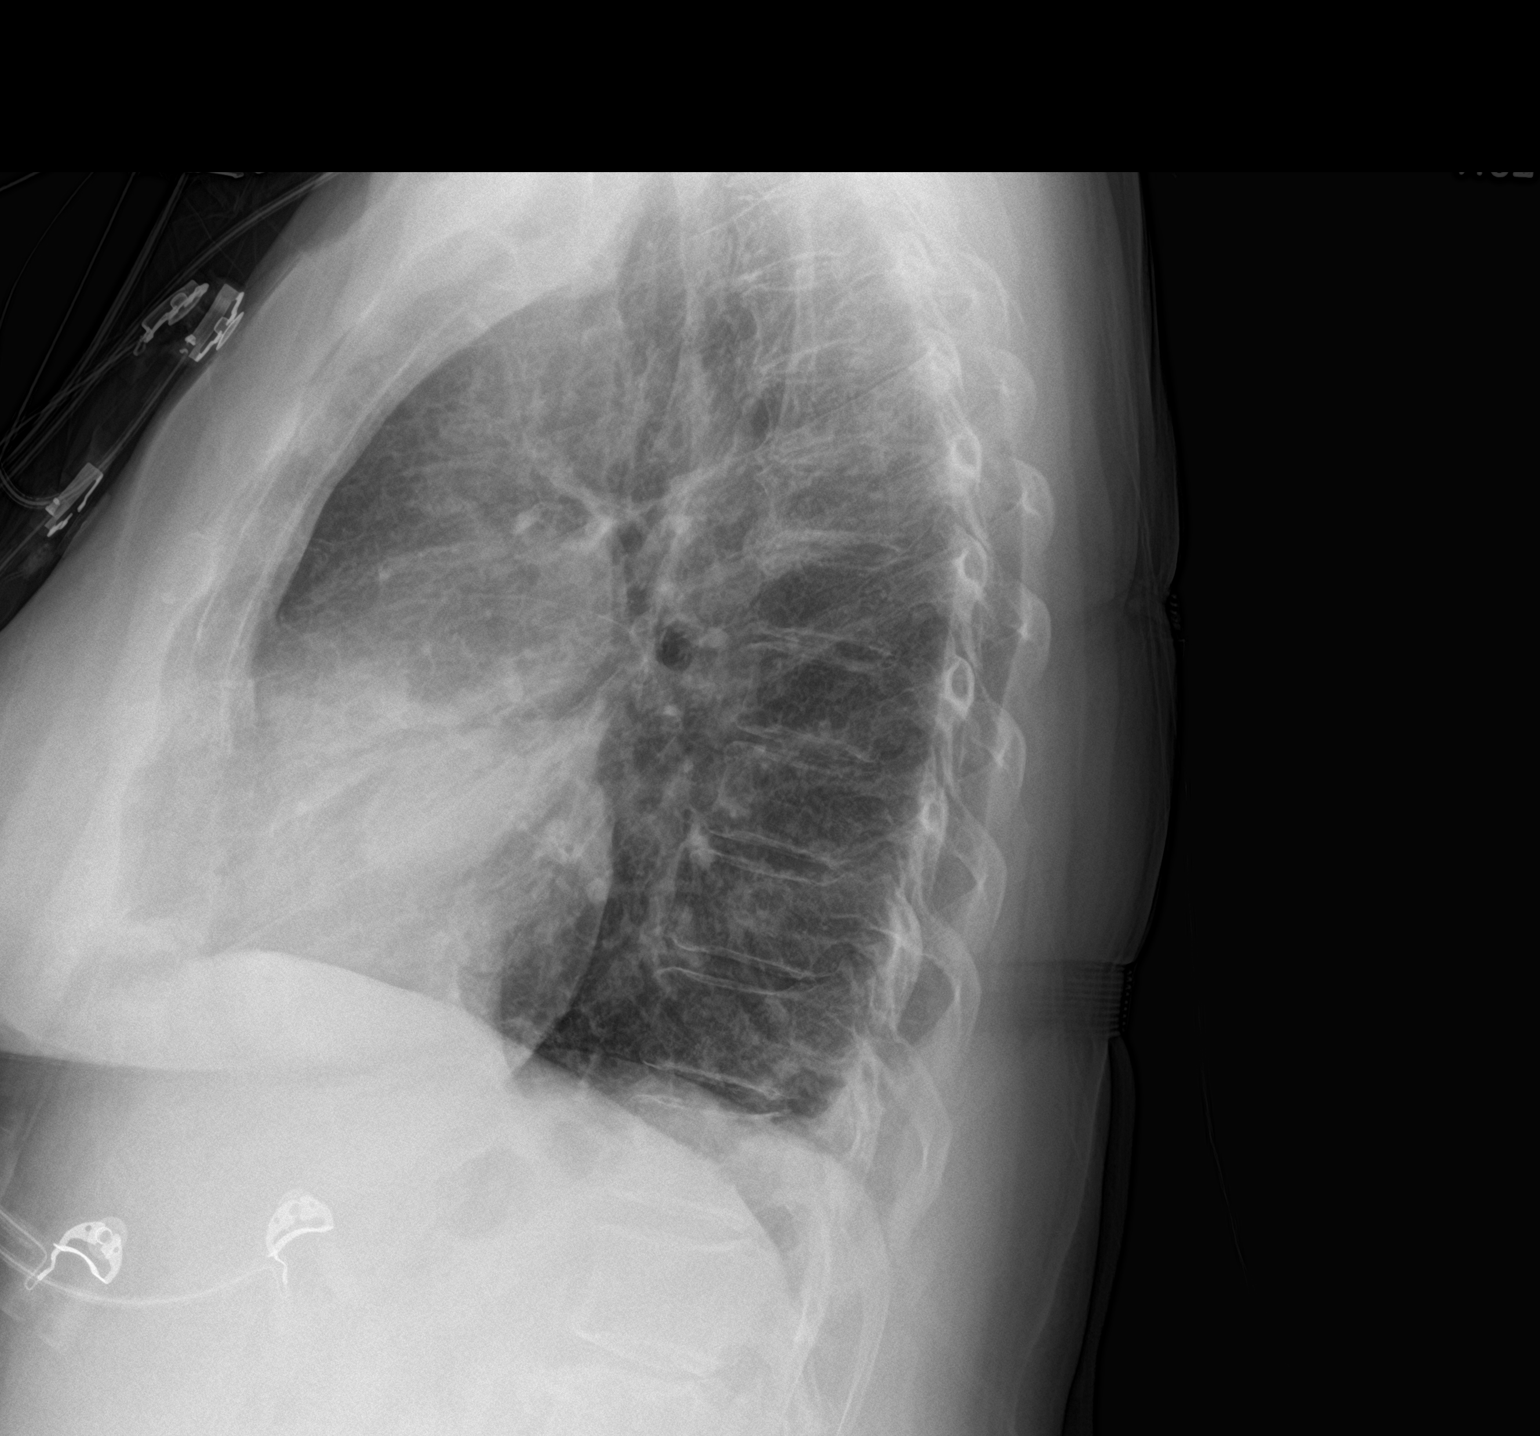

[2 of 2 positions shown; findings below may reference images not displayed]

FINDINGS: Unchanged cardiomediastinal silhouette. There are mild diffuse
interstitial opacities. There is increased airspace disease in the
right middle lobe. No pleural effusion. No pneumothorax. No acute
osseous abnormality.
IMPRESSION: Right middle lobe pneumonia.

Mild diffuse interstitial opacities, possibly superimposed mild
interstitial edema.

## 2023-12-01 IMAGING — DX DG CHEST 2V
2 series · 2 of 2 positions shown · non-contrast
Comparison: 10/10/2021, 10/09/2021 and 06/14/2021.

CLINICAL DATA: Follow-up pneumonia, cough and shortness of breath.
Former smoker.

EXAM:
CHEST - 2 VIEW

[chest pa]
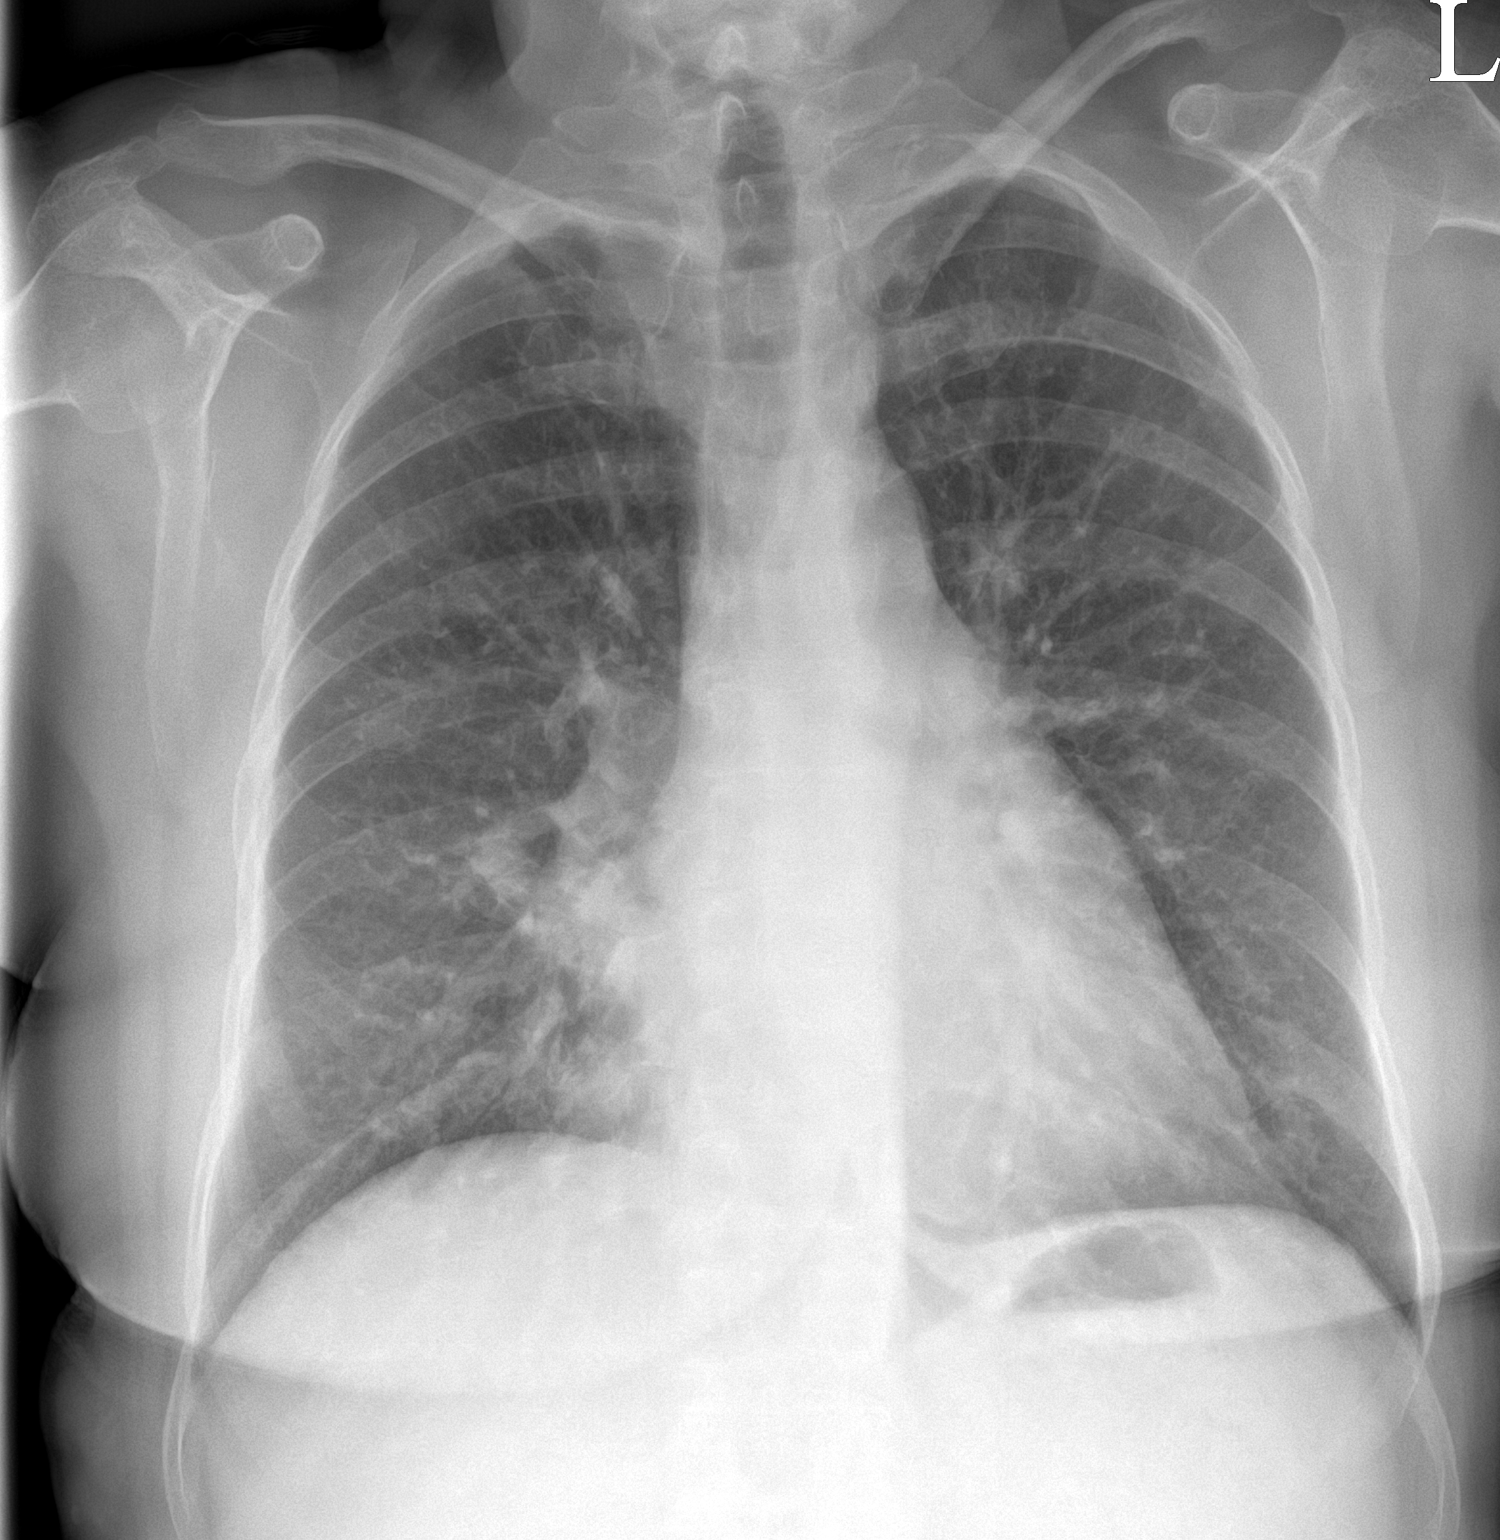

[chest lat]
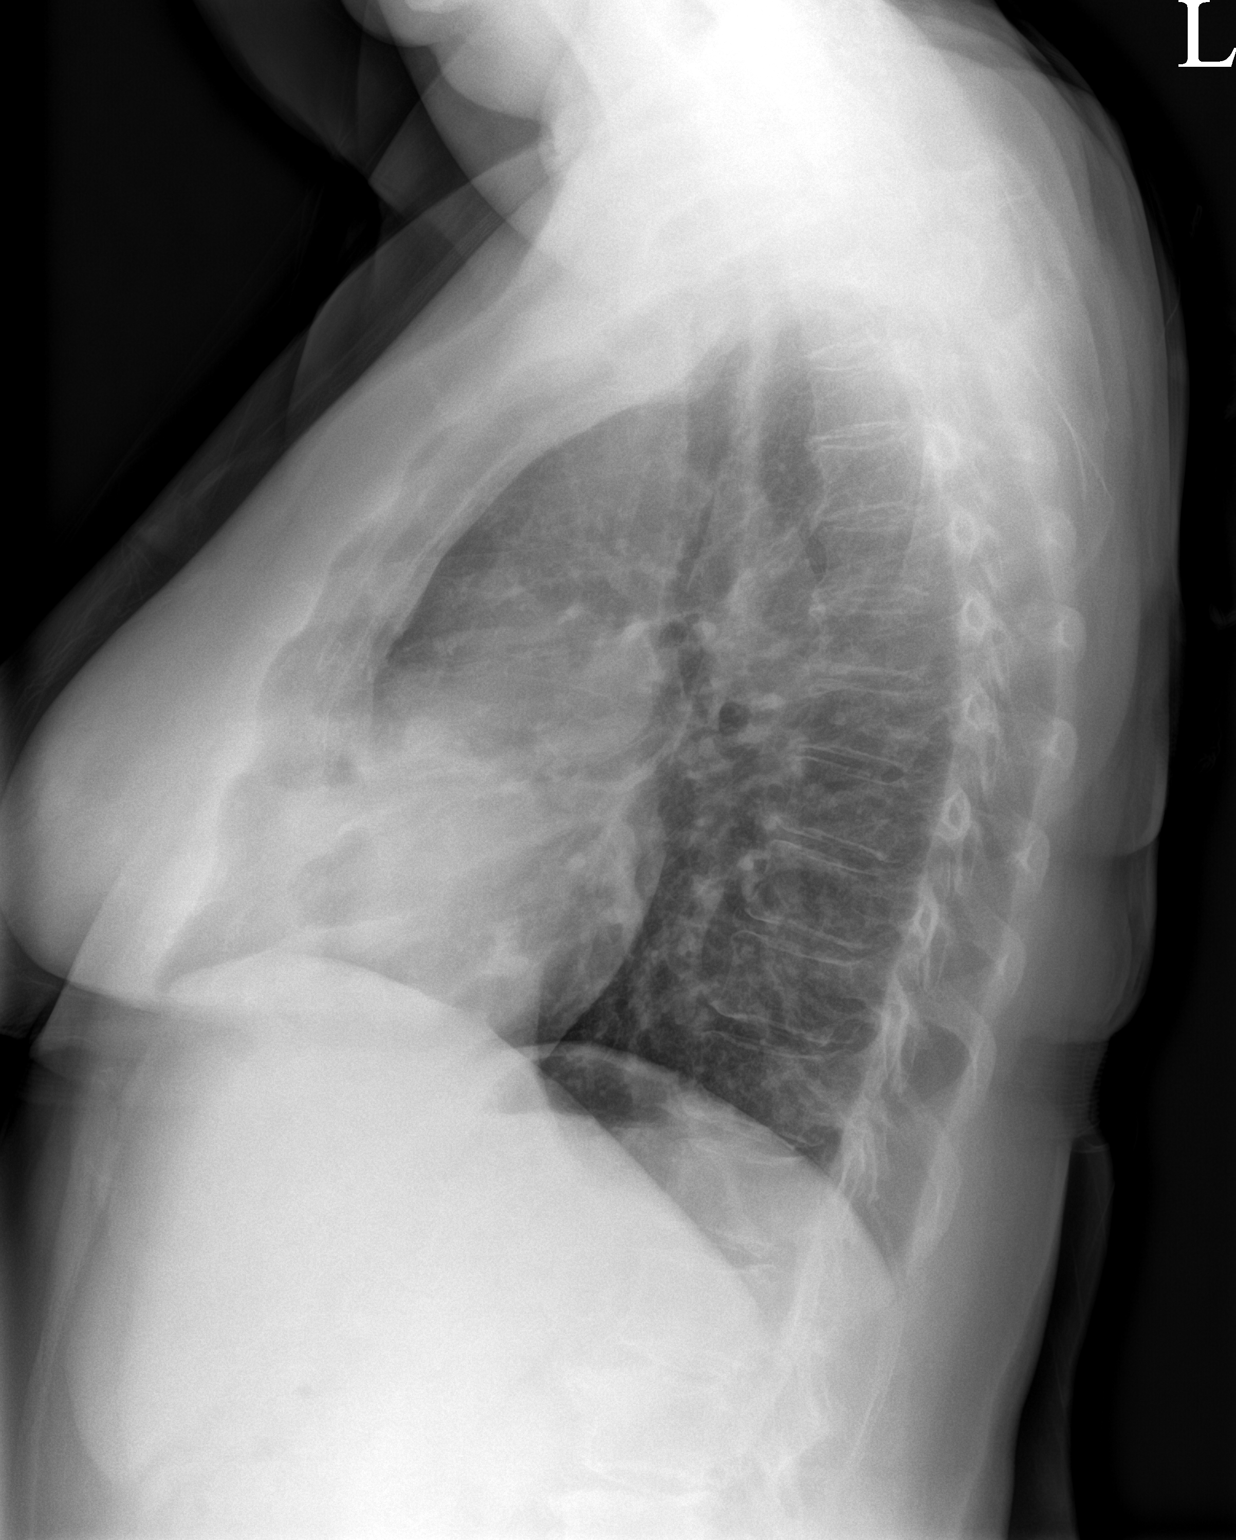

[2 of 2 positions shown; findings below may reference images not displayed]

FINDINGS: Trachea is midline. Heart size stable. Right hilar and infrahilar
fullness, progressive from 10/10/2021. Left lung is clear. No
pleural fluid. Biapical pleural thickening.
IMPRESSION: Right middle lobe opacification appears progressive. Probable
associated right hilar adenopathy. While findings may be due to
worsening pneumonia, malignancy is also a consideration. Consider
nonemergent CT chest with contrast in further evaluation, as
clinically indicated.

## 2023-12-23 IMAGING — CT CT CHEST W/O CM
2 of 5 series · 15 of 36 positions shown, 18 images · non-contrast
Comparison: Previous chest radiographs including the study done on
10/19/2021

CLINICAL DATA: Pneumonia



[Series 4: chest 2.00 br40 s3 · coronal · 0.59mm/px · 3 of 180 slices shown]
[im 36/180  lung]
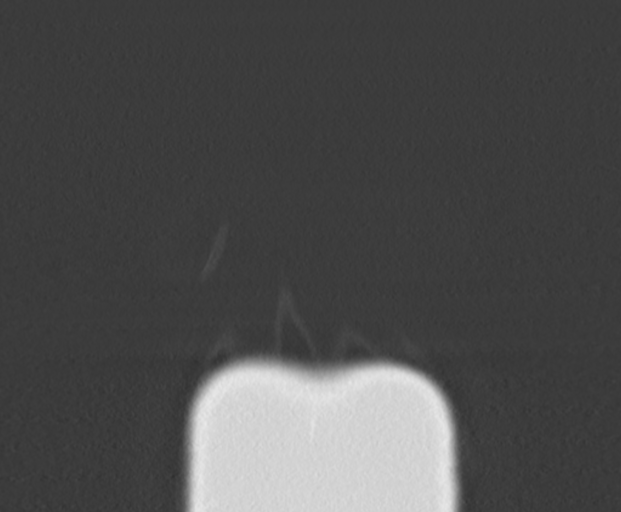
[im 72/180  lung]
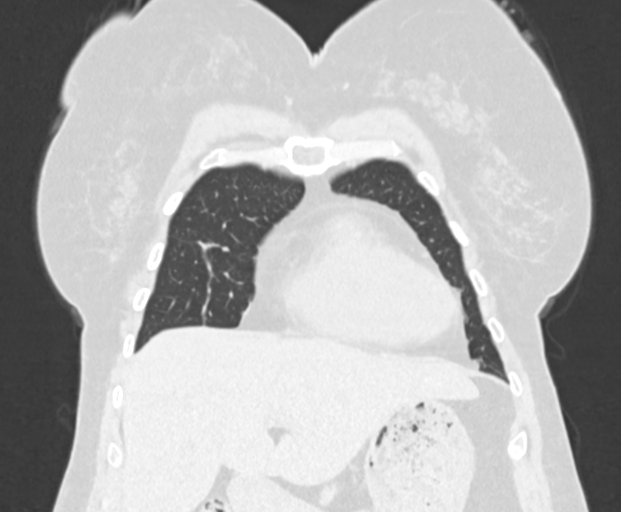
[im 108/180  lung]
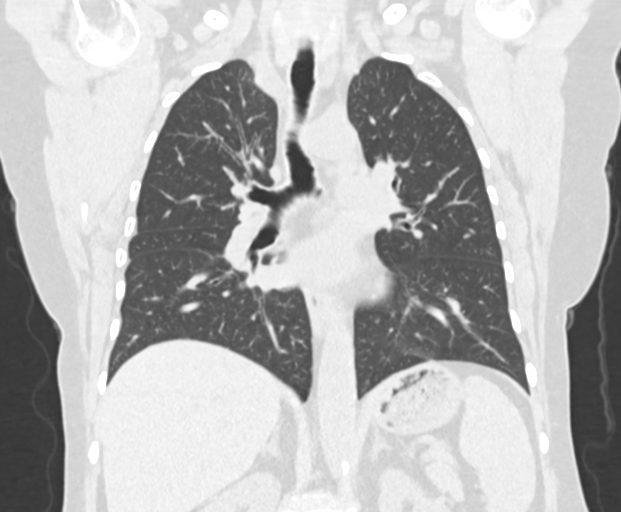

[Series 10: chest 1.00 br40 s3 super d · axial · 0.72mm/px · z∈[+1619,+1880]mm · 12 of 378 slices shown, 15 images]
[im 26/378  mediastinal]
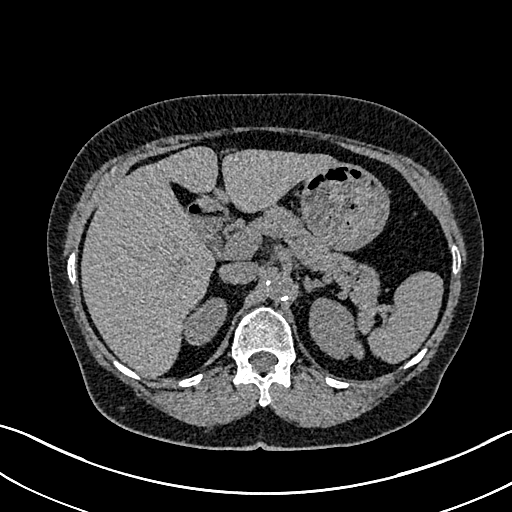
[im 26/378  lung]
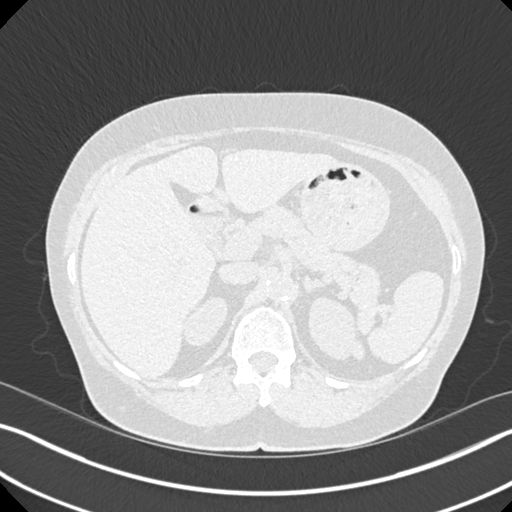
[im 51/378  lung]
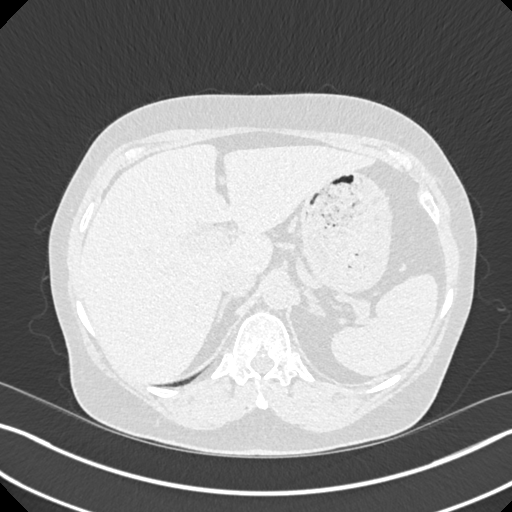
[im 76/378  lung]
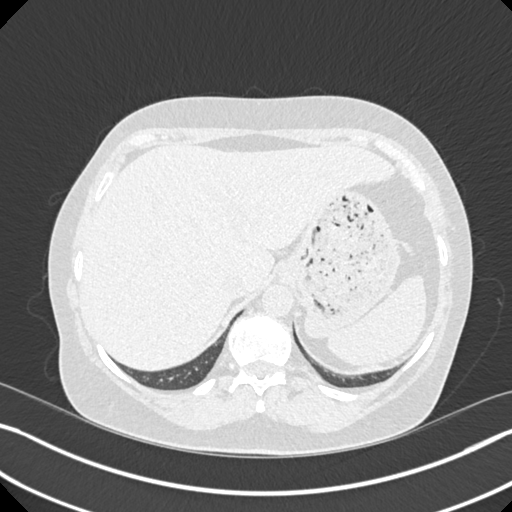
[im 126/378  lung]
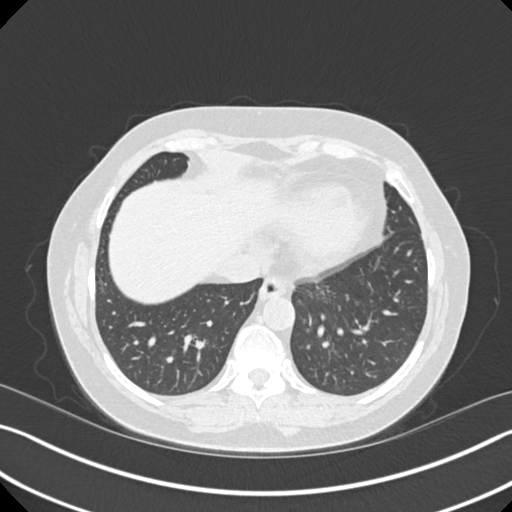
[im 151/378  mediastinal]
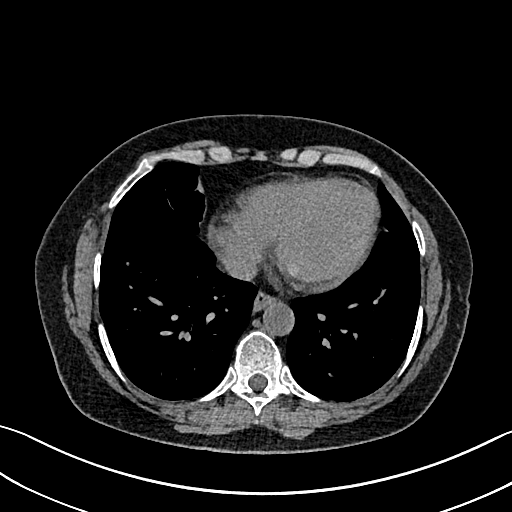
[im 151/378  lung]
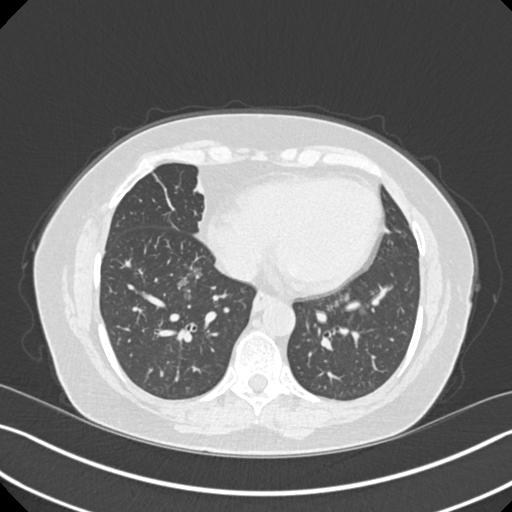
[im 176/378  lung]
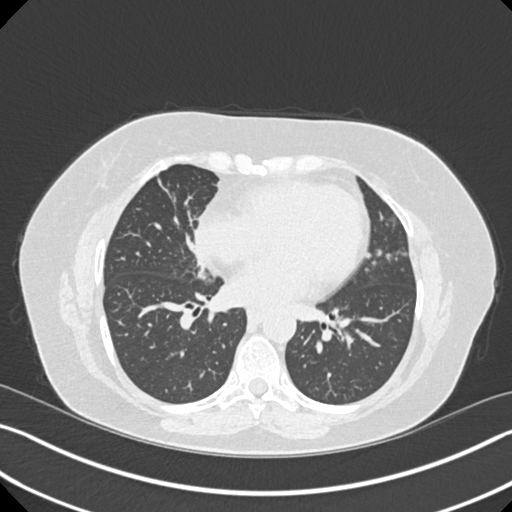
[im 202/378  lung]
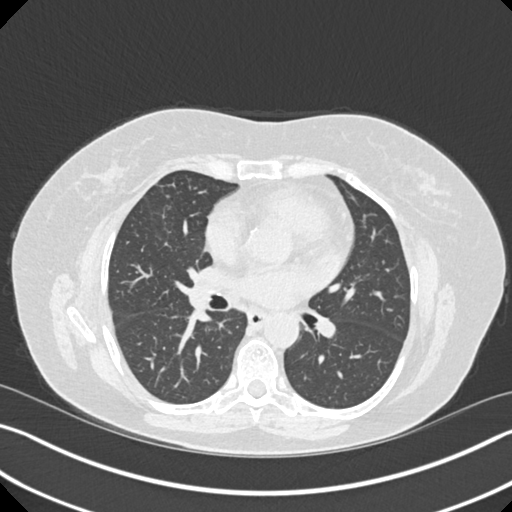
[im 227/378  lung]
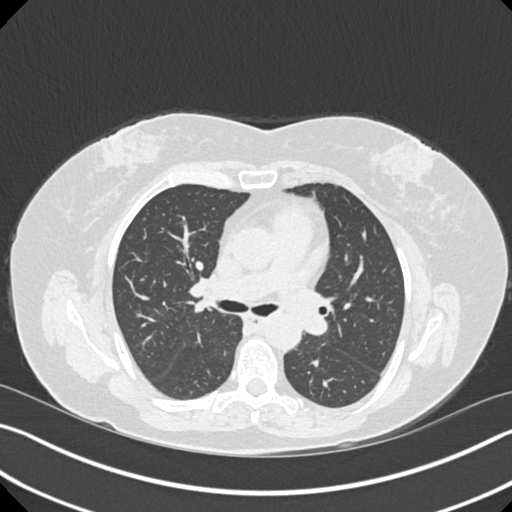
[im 252/378  mediastinal]
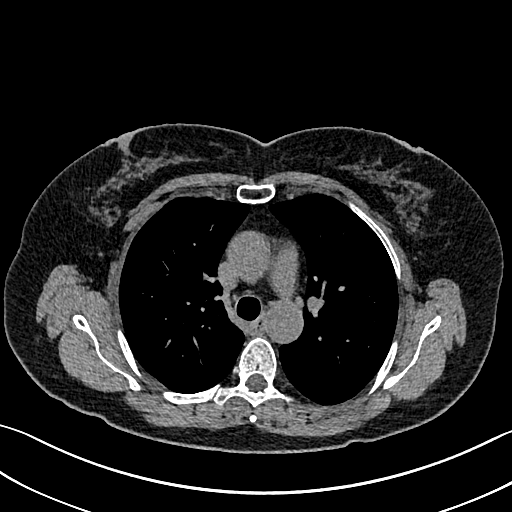
[im 252/378  lung]
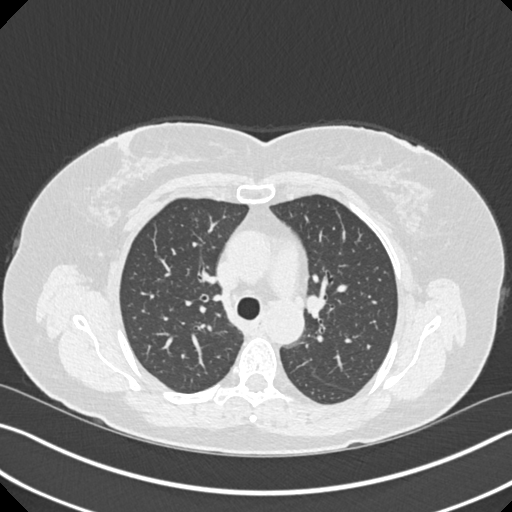
[im 302/378  lung]
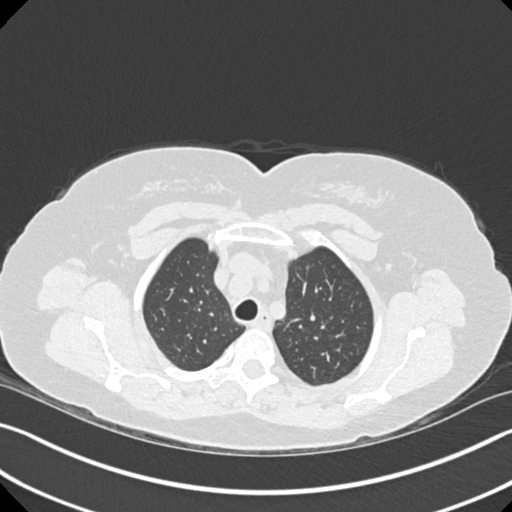
[im 327/378  lung]
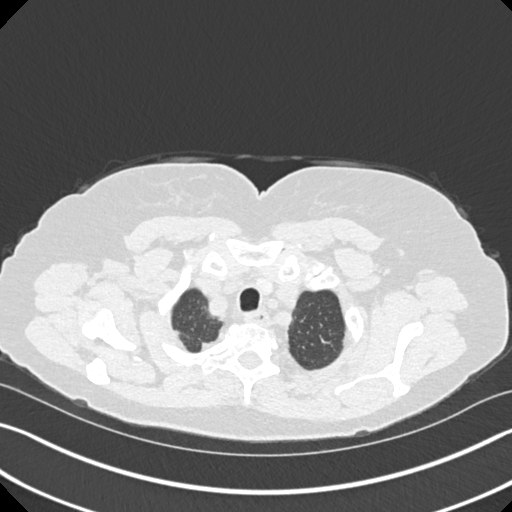
[im 352/378  lung]
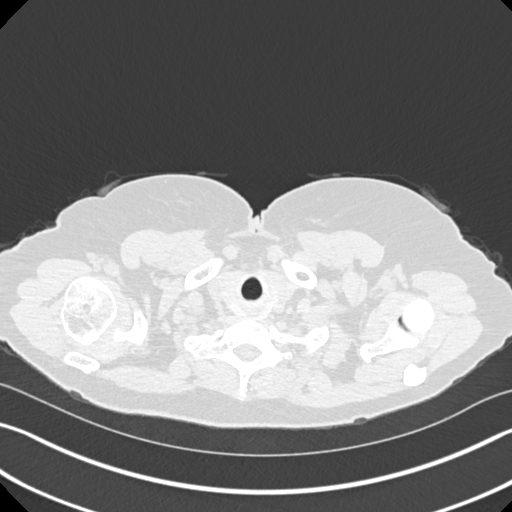

[15 of 36 positions shown; findings below may reference images not displayed]

FINDINGS: Cardiovascular: Minimal pericardial effusion is seen.

Mediastinum/Nodes: No significant lymphadenopathy is seen in the
mediastinum and hilar regions.

Lungs/Pleura: Small linear patchy infiltrates are seen in the medial
segment of right middle lobe and lingula. There are small foci of
ground-glass densities in both lower lobes. There is no focal
pulmonary consolidation.

Upper Abdomen: Scattered diverticula are seen in the splenic
flexure.

Musculoskeletal: Unremarkable.
IMPRESSION: Small linear patchy infiltrates are seen in the medial segment of
right middle lobe and in the lingula suggesting residual pneumonia
and subsegmental atelectasis. There are small foci of ground-glass
densities in both lower lobes suggesting scarring or interstitial
pneumonia. There is no focal pulmonary consolidation. There is no
pleural effusion. No significant lymphadenopathy is seen.

Few diverticula are seen in the visualized portions of colon.

## 2024-01-20 IMAGING — DX DG CHEST 2V
2 series · 2 of 2 positions shown · non-contrast
Comparison: October 19, 2021

CLINICAL DATA: Follow-up pneumonia

EXAM:
CHEST - 2 VIEW

[chest pa]
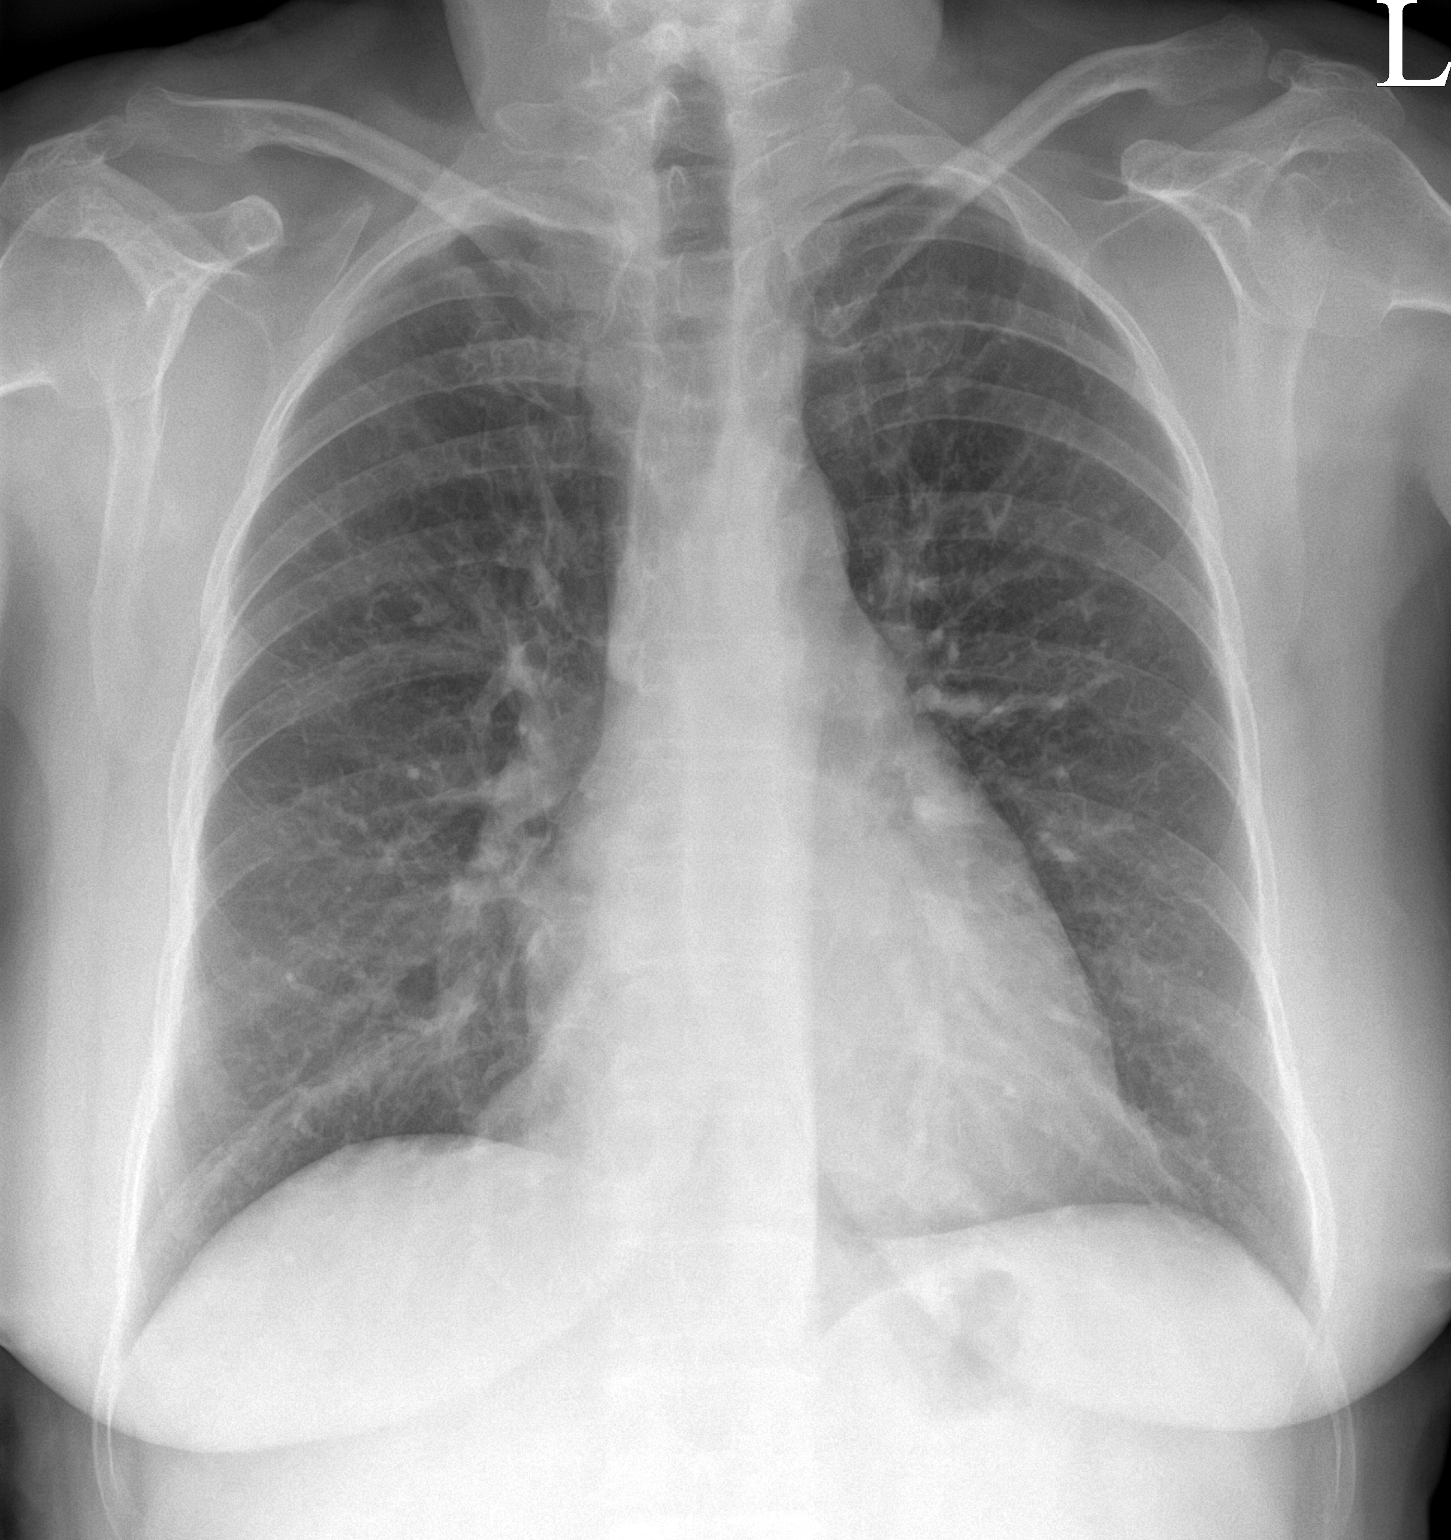

[chest lat]
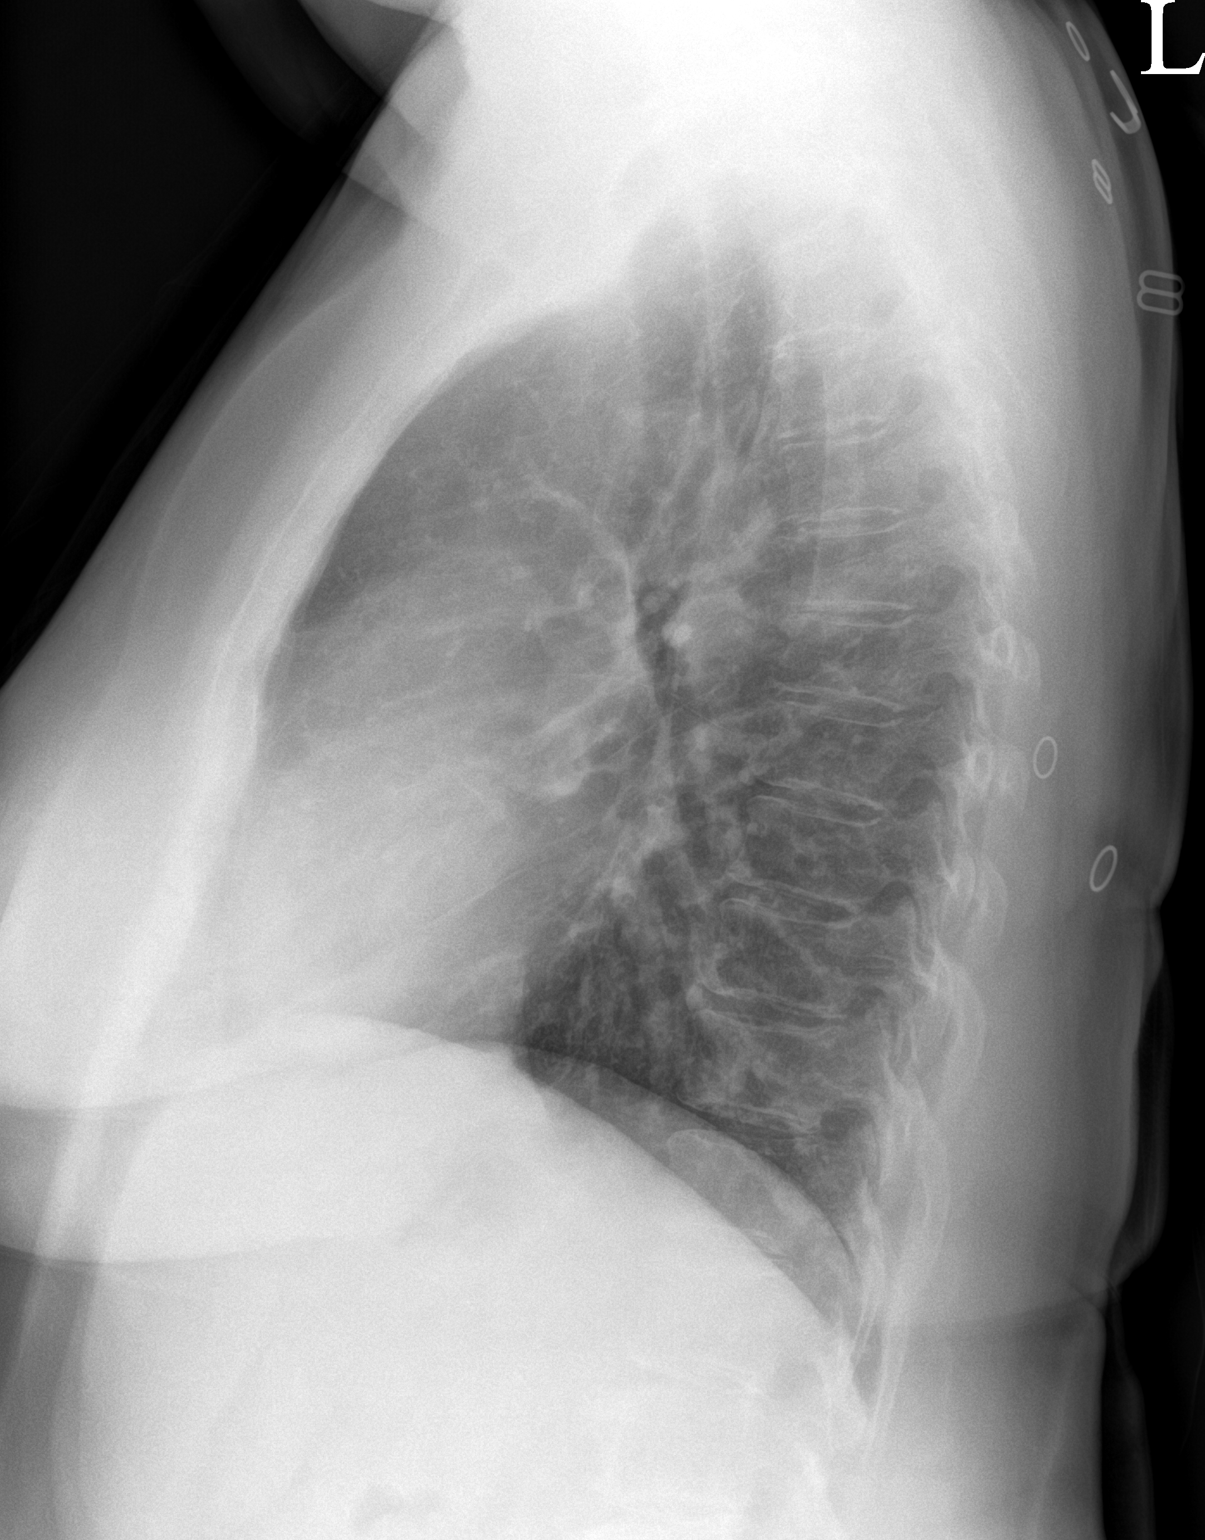

[2 of 2 positions shown; findings below may reference images not displayed]

FINDINGS: Cardiomediastinal silhouette is normal. No pneumothorax. Interval
resolution of the right middle lobe pneumonia. No suspicious nodules
or masses. No new infiltrates.
IMPRESSION: Interval resolution of the previously identified right middle lobe
pneumonia. No other abnormalities.
